# Patient Record
Sex: Female | Born: 1995 | Race: Black or African American | Hispanic: No | Marital: Single | Smoking: Never smoker
Health system: Southern US, Community
[De-identification: ages and names within clinical notes are randomized; demographics above are authoritative.]

## PROBLEM LIST (undated history)

## (undated) ENCOUNTER — Inpatient Hospital Stay (HOSPITAL_COMMUNITY): Payer: Self-pay

## (undated) DIAGNOSIS — M5020 Other cervical disc displacement, unspecified cervical region: Secondary | ICD-10-CM

## (undated) DIAGNOSIS — K219 Gastro-esophageal reflux disease without esophagitis: Secondary | ICD-10-CM

## (undated) HISTORY — DX: Gastro-esophageal reflux disease without esophagitis: K21.9

---
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
## 2017-11-12 NOTE — L&D Delivery Note (Signed)
Patient: Marie Mathews MRN: [id number]  GBS status: negative  Patient is a 22 y.o. now G2P1101 s/p NSVD at 4961w1d, who was admitted for SOL and SROM. Labor complicated by OP presentation, and augmented with IV Pitocin. SROM 17h 6371m prior to delivery with clear fluid. First stage of labor complete at 01:40 am.  Delivery Note After < 30 minutes of pushing, at 2:08 AM a viable female was delivered via Vaginal, Spontaneous (Presentation: vertex; OA).  APGAR: 8, 9; weight  pending  Placenta status: intact.  Cord:  3-vessel  Cord pH: 7.24  Head delivered OA. No nuchal cord present. Shoulder and body delivered in usual fashion. Infant placed on mother's abdomen, dried and bulb suctioned, but only weak cry and poor tone, so cord clamped and cut and infant sent to warmer. Cord arterial pH and cord blood drawn. Placenta delivered spontaneously with gentle cord traction. Fundus firm with massage and Pitocin. Perineum inspected and found to have no lacerations, just superficial abraisons which werefound to be hemostatic.  Anesthesia:  Epidural Episiotomy: None Lacerations: None Suture Repair: none Est. Blood Loss (mL): 150  Mom to postpartum.  Baby to Couplet care / Skin to Skin.  Raynelle FanningJulie P. Julina Altmann, MD OB Fellow 12/23/17, 3:13 AM

## 2017-11-30 ENCOUNTER — Encounter (HOSPITAL_COMMUNITY): Payer: Self-pay

## 2017-11-30 ENCOUNTER — Inpatient Hospital Stay (HOSPITAL_COMMUNITY)
Admission: AD | Admit: 2017-11-30 | Discharge: 2017-11-30 | Disposition: A | Discharge status: Home / Self Care | Payer: Medicaid Other | Source: Ambulatory Visit | Attending: Obstetrics and Gynecology | Admitting: Obstetrics and Gynecology

## 2017-11-30 DIAGNOSIS — O26893 Other specified pregnancy related conditions, third trimester: Secondary | ICD-10-CM

## 2017-11-30 DIAGNOSIS — Z3A35 35 weeks gestation of pregnancy: Secondary | ICD-10-CM | POA: Diagnosis not present

## 2017-11-30 DIAGNOSIS — R1030 Lower abdominal pain, unspecified: Secondary | ICD-10-CM | POA: Diagnosis present

## 2017-11-30 DIAGNOSIS — R109 Unspecified abdominal pain: Secondary | ICD-10-CM

## 2017-11-30 DIAGNOSIS — R030 Elevated blood-pressure reading, without diagnosis of hypertension: Secondary | ICD-10-CM | POA: Insufficient documentation

## 2017-11-30 LAB — URINALYSIS, ROUTINE W REFLEX MICROSCOPIC
Bilirubin Urine: NEGATIVE
GLUCOSE, UA: NEGATIVE mg/dL
HGB URINE DIPSTICK: NEGATIVE
Ketones, ur: NEGATIVE mg/dL
Leukocytes, UA: NEGATIVE
Nitrite: NEGATIVE
Protein, ur: NEGATIVE mg/dL
SPECIFIC GRAVITY, URINE: 1.004 — AB (ref 1.005–1.030)
pH: 6 (ref 5.0–8.0)

## 2017-11-30 NOTE — MAU Provider Note (Signed)
History     CSN: [id number]  Arrival date and time: 11/30/17 [id number]   First Provider Initiated Contact with Patient 11/30/17 0101      Chief Complaint  Patient presents with  . Pelvic Pain   HPI  Marie Mathews is a 22 y.o. G2P0100 at 260w6d who presents to MAU today with complaint of intermittent sharp lower abdominal pains that started 20 minutes prior to arrival. Patient states pain is mostly associated with baby movements. She denies vaginal bleeding or LOF. She is unsure about contractions. She reports normal fetal movement.   OB History    Gravida Para Term Preterm AB Living   2 1   1  0     SAB TAB Ectopic Multiple Live Births   0              No past medical history on file.  No past surgical history on file.  No family history on file.  Social History   Tobacco Use  . Smoking status: Never Smoker  . Smokeless tobacco: Never Used  Substance Use Topics  . Alcohol use: No  . Drug use: No    Allergies:  Allergies  Allergen Reactions  . Bee Venom Anaphylaxis    Medications Prior to Admission  Medication Sig Dispense Refill Last Dose  . acetaminophen-codeine (TYLENOL #3) 300-30 MG per tablet Take 1-2 tablets by mouth every 6 (six) hours as needed for moderate pain. 6 tablet 0   . docusate sodium (COLACE) 100 MG capsule Take 1 capsule (100 mg total) by mouth every 12 (twelve) hours. 60 capsule 0   . fluconazole (DIFLUCAN) 150 MG tablet Take 1 tablet (150 mg total) by mouth daily. 1 tablet 0   . metroNIDAZOLE (FLAGYL) 500 MG tablet Take 1 tablet (500 mg total) by mouth 2 (two) times daily. 14 tablet 0   . nystatin-triamcinolone (MYCOLOG II) cream Apply to affected area BID 30 g 0   . polyethylene glycol powder (GLYCOLAX/MIRALAX) powder Take 255 g by mouth once. TAKE 6 CAPFULS OF MIRALAX IN A 32 OUNCE GATORADE AND DRINK THE WHOLE BEVERAGE FOLLOWED BY 3 CAPFULS TWICE A DAY FOR THE NEXT WEEK AND FOLLOW UP WITH YOUR PRIMARY CARE PHYSICIAN. 255 g 0   . senna  (SENOKOT) 8.6 MG TABS tablet Take 2 tablets by mouth daily as needed for mild constipation.   09/22/2015 at Unknown time    Review of Systems  Constitutional: Negative for fever.  Gastrointestinal: Positive for abdominal pain. Negative for constipation, diarrhea, nausea and vomiting.  Genitourinary: Negative for vaginal bleeding and vaginal discharge.   Physical Exam   Blood pressure 121/66, pulse 100, temperature 98.6 F (37 C), temperature source Oral, resp. rate 18, height 5\' 3"  (1.6 m), weight 182 lb 1.3 oz (82.6 kg), SpO2 100 %.  Physical Exam  Nursing note and vitals reviewed. Constitutional: She is oriented to person, place, and time. She appears well-developed and well-nourished. No distress.  HENT:  Head: Normocephalic and atraumatic.  Cardiovascular: Normal rate.  Respiratory: Effort normal.  GI: Soft. She exhibits no distension and no mass. There is no tenderness. There is no rebound and no guarding.  Neurological: She is alert and oriented to person, place, and time.  Skin: Skin is warm and dry. No erythema.  Psychiatric: She has a normal mood and affect.  Dilation: 2 Effacement (%): Thick Station: -2 Exam by:: Harlon FlorJ. Arryn Terrones PA   Results for orders placed or performed during the hospital encounter of 11/30/17 (  from the past 24 hour(s))  Urinalysis, Routine w reflex microscopic     Status: Abnormal   Collection Time: 11/30/17 12:44 AM  Result Value Ref Range   Color, Urine YELLOW YELLOW   APPearance CLEAR CLEAR   Specific Gravity, Urine 1.004 (L) 1.005 - 1.030   pH 6.0 5.0 - 8.0   Glucose, UA NEGATIVE NEGATIVE mg/dL   Hgb urine dipstick NEGATIVE NEGATIVE   Bilirubin Urine NEGATIVE NEGATIVE   Ketones, ur NEGATIVE NEGATIVE mg/dL   Protein, ur NEGATIVE NEGATIVE mg/dL   Nitrite NEGATIVE NEGATIVE   Leukocytes, UA NEGATIVE NEGATIVE   Fetal Monitoring: Baseline: 125 bpm Variability: moderate Accelerations: 15 x 15 Decelerations: none Contractions: irregular with  moderate UI  MAU Course  Procedures None  MDM UA today - normal Contraction pattern does not indicate active labor. Patient reports improvement in pain with rest.   Assessment and Plan  A: SIUP at [redacted]w[redacted]d Abdominal pain in pregnancy, third trimester   P:  Discharge home Preterm precautions discussed Patient advised to follow-up with [facility] to continue prenatal care. In-basket message sent to make an appointment.  Patient may return to MAU as needed or if her condition were to change or worsen   Vonzella Nipple, PA-C 11/30/2017, 1:01 AM

## 2017-11-30 NOTE — Discharge Instructions (Signed)

## 2017-11-30 NOTE — MAU Note (Signed)
Pt reporting sharp pelvic pain 7/10 starting about 20 mins ago.  Denies leaking or bleeding. Lots of fetal movement.

## 2017-12-12 ENCOUNTER — Other Ambulatory Visit (HOSPITAL_COMMUNITY)
Admission: RE | Admit: 2017-12-12 | Discharge: 2017-12-12 | Disposition: A | Discharge status: Home / Self Care | Payer: Medicaid Other | Source: Ambulatory Visit | Attending: Student | Admitting: Student

## 2017-12-12 ENCOUNTER — Ambulatory Visit (INDEPENDENT_AMBULATORY_CARE_PROVIDER_SITE_OTHER): Payer: Medicaid Other | Admitting: Student

## 2017-12-12 ENCOUNTER — Encounter: Payer: Self-pay | Admitting: Student

## 2017-12-12 VITALS — BP 117/78 | HR 82 | Wt 183.0 lb

## 2017-12-12 DIAGNOSIS — B9689 Other specified bacterial agents as the cause of diseases classified elsewhere: Secondary | ICD-10-CM | POA: Diagnosis not present

## 2017-12-12 DIAGNOSIS — Z6791 Unspecified blood type, Rh negative: Secondary | ICD-10-CM | POA: Diagnosis not present

## 2017-12-12 DIAGNOSIS — O26899 Other specified pregnancy related conditions, unspecified trimester: Secondary | ICD-10-CM

## 2017-12-12 DIAGNOSIS — Z8759 Personal history of other complications of pregnancy, childbirth and the puerperium: Secondary | ICD-10-CM | POA: Insufficient documentation

## 2017-12-12 DIAGNOSIS — Z3480 Encounter for supervision of other normal pregnancy, unspecified trimester: Secondary | ICD-10-CM | POA: Diagnosis not present

## 2017-12-12 DIAGNOSIS — A749 Chlamydial infection, unspecified: Secondary | ICD-10-CM

## 2017-12-12 DIAGNOSIS — O09893 Supervision of other high risk pregnancies, third trimester: Secondary | ICD-10-CM | POA: Diagnosis present

## 2017-12-12 DIAGNOSIS — O98812 Other maternal infectious and parasitic diseases complicating pregnancy, second trimester: Secondary | ICD-10-CM

## 2017-12-12 DIAGNOSIS — D573 Sickle-cell trait: Secondary | ICD-10-CM

## 2017-12-12 DIAGNOSIS — O0933 Supervision of pregnancy with insufficient antenatal care, third trimester: Secondary | ICD-10-CM

## 2017-12-12 DIAGNOSIS — O0930 Supervision of pregnancy with insufficient antenatal care, unspecified trimester: Secondary | ICD-10-CM | POA: Insufficient documentation

## 2017-12-12 HISTORY — DX: Unspecified blood type, rh negative: Z67.91

## 2017-12-12 LAB — OB RESULTS CONSOLE GBS: GBS: NEGATIVE

## 2017-12-12 MED ORDER — RHO D IMMUNE GLOBULIN 1500 UNIT/2ML IJ SOSY
300.0000 ug | PREFILLED_SYRINGE | Freq: Once | INTRAMUSCULAR | Status: AC
  Administered 2017-12-12: 300 ug via INTRAMUSCULAR

## 2017-12-12 MED ORDER — FERROUS SULFATE 325 (65 FE) MG PO TABS: 325.0000 mg | ORAL_TABLET | Freq: Every day | ORAL | 3 refills | Status: DC

## 2017-12-12 MED ORDER — PRENATAL VITAMINS 0.8 MG PO TABS: 1.0000 | ORAL_TABLET | Freq: Every day | ORAL | 12 refills | Status: DC

## 2017-12-12 NOTE — Progress Notes (Addendum)
  Subjective:    Marie Mathews is being seen today for her first obstetrical visit.  This is not a planned pregnancy. She is at 1974w4d gestation. Her obstetrical history is significant for previous pre-term delivery and fetal death at 20 weeks. 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  Relationship with FOB: not involved today. . Patient does intend to breast feed. Pregnancy history fully reviewed.  Patient has had intermittent prenatal care. [facility] [place] [facility] [place] [facility] [place] [facility] and also "another doctor". Her records do not come with an US report but patient says that she was told her US was normal.   Patient reports no complaints and painful boil on her labia that she has popped and it is now hurting. .  Review of Systems:   Review of Systems  Constitutional: Negative.   HENT: Negative.   Eyes: Negative.   Respiratory: Negative.   Cardiovascular: Negative.   Gastrointestinal: Negative.   Genitourinary: Positive for vaginal pain.  Neurological: Negative.   Hematological: Negative.   Psychiatric/Behavioral: Negative.     Objective:     BP 117/78   Pulse 82   Wt 183 lb (83 kg)   LMP  (Approximate)   BMI 32.42 kg/m  Physical Exam  Constitutional: She is oriented to person, place, and time. She appears well-developed and well-nourished.  HENT:  Head: Normocephalic.  Eyes: Pupils are equal, round, and reactive to light.  Neck: Normal range of motion.  GI: Soft.  Genitourinary: No vaginal discharge found.  Genitourinary Comments: Patient has a red open scab the size of a pencil eraser on her right labia; it is tender to the touch. No odor, no oozing of pus.   Musculoskeletal: Normal range of motion.  Neurological: She is alert and oriented to person, place, and time.  Skin: Skin is warm and dry.    Maternal Exam:  Introitus: Vagina is negative for discharge.       Assessment:    Pregnancy:  G2P0100 Patient Active Problem List   Diagnosis Date Noted  . Sickle cell trait (HCC) 12/13/2017  . Chlamydia infection affecting pregnancy 12/13/2017  . Supervision of other normal pregnancy, antepartum 12/12/2017  . History of neonatal death 12/12/2017  . Rh negative state in antepartum period 12/12/2017  . Preterm delivery 12/12/2017  . Limited prenatal care 12/12/2017       Plan:     Initial labs drawn. Prenatal vitamins. Problem list reviewed and updated. AFP3 discussed: too late. . Role of ultrasound in pregnancy discussed; fetal survey: talked with patient about her US from her previous provider; patients says the US was normal and her placenta was in a normal location. . Amniocentesis discussed: not indicated. Follow up in 1 weeks. 75% of 30 min visit spent on counseling and coordination of care.  -Will re-do 1 hour Glucola today as we have no record of her her GTT -Spoke with Dr. Debroah LoopArnold, who doesn't feel its necessary to repeat an anatomy scan now -Rhogam given -Order for iron and PNV sent -Patient will follow up in one week -Recommended antibiotic cream and avoid touching boil; do not wear tight clothing and leave the area clean and dry -labs will be abstracted from previous records -Urine culture today -GBS and GC CT today -confirmed vertex presentation Charles[facility] GaribaldiKathryn Lorraine [facility] 12/13/2017

## 2017-12-12 NOTE — Patient Instructions (Signed)

## 2017-12-13 ENCOUNTER — Other Ambulatory Visit: Payer: Medicaid Other

## 2017-12-13 DIAGNOSIS — O98819 Other maternal infectious and parasitic diseases complicating pregnancy, unspecified trimester: Secondary | ICD-10-CM

## 2017-12-13 DIAGNOSIS — D573 Sickle-cell trait: Secondary | ICD-10-CM | POA: Insufficient documentation

## 2017-12-13 DIAGNOSIS — A749 Chlamydial infection, unspecified: Secondary | ICD-10-CM | POA: Insufficient documentation

## 2017-12-13 HISTORY — DX: Sickle-cell trait: D57.3

## 2017-12-13 LAB — CERVICOVAGINAL ANCILLARY ONLY
BACTERIAL VAGINITIS: POSITIVE — AB
CANDIDA VAGINITIS: NEGATIVE
Chlamydia: NEGATIVE
NEISSERIA GONORRHEA: NEGATIVE
Trichomonas: NEGATIVE

## 2017-12-14 ENCOUNTER — Other Ambulatory Visit: Payer: Self-pay | Admitting: Student

## 2017-12-14 DIAGNOSIS — N760 Acute vaginitis: Principal | ICD-10-CM

## 2017-12-14 DIAGNOSIS — B9689 Other specified bacterial agents as the cause of diseases classified elsewhere: Secondary | ICD-10-CM | POA: Insufficient documentation

## 2017-12-14 LAB — CULTURE, OB URINE

## 2017-12-14 LAB — URINE CULTURE, OB REFLEX

## 2017-12-14 MED ORDER — METRONIDAZOLE 500 MG PO TABS: 500.0000 mg | ORAL_TABLET | Freq: Two times a day (BID) | ORAL | 0 refills | Status: DC

## 2017-12-16 LAB — CULTURE, BETA STREP (GROUP B ONLY): Strep Gp B Culture: NEGATIVE

## 2017-12-19 ENCOUNTER — Ambulatory Visit (INDEPENDENT_AMBULATORY_CARE_PROVIDER_SITE_OTHER): Payer: Medicaid Other | Admitting: Obstetrics & Gynecology

## 2017-12-19 ENCOUNTER — Encounter: Payer: Self-pay | Admitting: Obstetrics & Gynecology

## 2017-12-19 VITALS — BP 124/80 | HR 72 | Wt 186.0 lb

## 2017-12-19 DIAGNOSIS — O09893 Supervision of other high risk pregnancies, third trimester: Secondary | ICD-10-CM | POA: Diagnosis not present

## 2017-12-19 DIAGNOSIS — O98813 Other maternal infectious and parasitic diseases complicating pregnancy, third trimester: Secondary | ICD-10-CM

## 2017-12-19 DIAGNOSIS — Z8759 Personal history of other complications of pregnancy, childbirth and the puerperium: Secondary | ICD-10-CM

## 2017-12-19 DIAGNOSIS — Z6791 Unspecified blood type, Rh negative: Secondary | ICD-10-CM

## 2017-12-19 DIAGNOSIS — A749 Chlamydial infection, unspecified: Secondary | ICD-10-CM

## 2017-12-19 DIAGNOSIS — O26899 Other specified pregnancy related conditions, unspecified trimester: Secondary | ICD-10-CM

## 2017-12-19 DIAGNOSIS — Z3480 Encounter for supervision of other normal pregnancy, unspecified trimester: Secondary | ICD-10-CM

## 2017-12-19 NOTE — Patient Instructions (Signed)
Vaginal Delivery Vaginal delivery means that you will give birth by pushing your baby out of your birth canal (vagina). A team of health care providers will help you before, during, and after vaginal delivery. Birth experiences are unique for every woman and every pregnancy, and birth experiences vary depending on where you choose to give birth. What should I do to prepare for my baby's birth? Before your baby is born, it is important to talk with your health care provider about:  Your labor and delivery preferences. These may include: ? Medicines that you may be given. ? How you will manage your pain. This might include non-medical pain relief techniques or injectable pain relief such as epidural analgesia. ? How you and your baby will be monitored during labor and delivery. ? Who may be in the labor and delivery room with you. ? Your feelings about surgical delivery of your baby (cesarean delivery, or C-section) if this becomes necessary. ? Your feelings about receiving donated blood through an IV tube (blood transfusion) if this becomes necessary.  Whether you are able: ? To take pictures or videos of the birth. ? To eat during labor and delivery. ? To move around, walk, or change positions during labor and delivery.  What to expect after your baby is born, such as: ? Whether delayed umbilical cord clamping and cutting is offered. ? Who will care for your baby right after birth. ? Medicines or tests that may be recommended for your baby. ? Whether breastfeeding is supported in your hospital or birth center. ? How long you will be in the hospital or birth center.  How any medical conditions you have may affect your baby or your labor and delivery experience.  To prepare for your baby's birth, you should also:  Attend all of your health care visits before delivery (prenatal visits) as recommended by your health care provider. This is important.  Prepare your home for your baby's  arrival. Make sure that you have: ? Diapers. ? Baby clothing. ? Feeding equipment. ? Safe sleeping arrangements for you and your baby.  Install a car seat in your vehicle. Have your car seat checked by a certified car seat installer to make sure that it is installed safely.  Think about who will help you with your new baby at home for at least the first several weeks after delivery.  What can I expect when I arrive at the birth center or hospital? Once you are in labor and have been admitted into the hospital or birth center, your health care provider may:  Review your pregnancy history and any concerns you have.  Insert an IV tube into one of your veins. This is used to give you fluids and medicines.  Check your blood pressure, pulse, temperature, and heart rate (vital signs).  Check whether your bag of water (amniotic sac) has broken (ruptured).  Talk with you about your birth plan and discuss pain control options.  Monitoring Your health care provider may monitor your contractions (uterine monitoring) and your baby's heart rate (fetal monitoring). You may need to be monitored:  Often, but not continuously (intermittently).  All the time or for long periods at a time (continuously). Continuous monitoring may be needed if: ? You are taking certain medicines, such as medicine to relieve pain or make your contractions stronger. ? You have pregnancy or labor complications.  Monitoring may be done by:  Placing a special stethoscope or a handheld monitoring device on your abdomen to   check your baby's heartbeat, and feeling your abdomen for contractions. This method of monitoring does not continuously record your baby's heartbeat or your contractions.  Placing monitors on your abdomen (external monitors) to record your baby's heartbeat and the frequency and length of contractions. You may not have to wear external monitors all the time.  Placing monitors inside of your uterus  (internal monitors) to record your baby's heartbeat and the frequency, length, and strength of your contractions. ? Your health care provider may use internal monitors if he or she needs more information about the strength of your contractions or your baby's heart rate. ? Internal monitors are put in place by passing a thin, flexible wire through your vagina and into your uterus. Depending on the type of monitor, it may remain in your uterus or on your baby's head until birth. ? Your health care provider will discuss the benefits and risks of internal monitoring with you and will ask for your permission before inserting the monitors.  Telemetry. This is a type of continuous monitoring that can be done with external or internal monitors. Instead of having to stay in bed, you are able to move around during telemetry. Ask your health care provider if telemetry is an option for you.  Physical exam Your health care provider may perform a physical exam. This may include:  Checking whether your baby is positioned: ? With the head toward your vagina (head-down). This is most common. ? With the head toward the top of your uterus (head-up or breech). If your baby is in a breech position, your health care provider may try to turn your baby to a head-down position so you can deliver vaginally. If it does not seem that your baby can be born vaginally, your provider may recommend surgery to deliver your baby. In rare cases, you may be able to deliver vaginally if your baby is head-up (breech delivery). ? Lying sideways (transverse). Babies that are lying sideways cannot be delivered vaginally.  Checking your cervix to determine: ? Whether it is thinning out (effacing). ? Whether it is opening up (dilating). ? How low your baby has moved into your birth canal.  What are the three stages of labor and delivery?  Normal labor and delivery is divided into the following three stages: Stage 1  Stage 1 is the  longest stage of labor, and it can last for hours or days. Stage 1 includes: ? Early labor. This is when contractions may be irregular, or regular and mild. Generally, early labor contractions are more than 10 minutes apart. ? Active labor. This is when contractions get longer, more regular, more frequent, and more intense. ? The transition phase. This is when contractions happen very close together, are very intense, and may last longer than during any other part of labor.  Contractions generally feel mild, infrequent, and irregular at first. They get stronger, more frequent (about every 2-3 minutes), and more regular as you progress from early labor through active labor and transition.  Many women progress through stage 1 naturally, but you may need help to continue making progress. If this happens, your health care provider may talk with you about: ? Rupturing your amniotic sac if it has not ruptured yet. ? Giving you medicine to help make your contractions stronger and more frequent.  Stage 1 ends when your cervix is completely dilated to 4 inches (10 cm) and completely effaced. This happens at the end of the transition phase. Stage 2  Once   your cervix is completely effaced and dilated to 4 inches (10 cm), you may start to feel an urge to push. It is common for the body to naturally take a rest before feeling the urge to push, especially if you received an epidural or certain other pain medicines. This rest period may last for up to 1-2 hours, depending on your unique labor experience.  During stage 2, contractions are generally less painful, because pushing helps relieve contraction pain. Instead of contraction pain, you may feel stretching and burning pain, especially when the widest part of your baby's head passes through the vaginal opening (crowning).  Your health care provider will closely monitor your pushing progress and your baby's progress through the vagina during stage 2.  Your  health care provider may massage the area of skin between your vaginal opening and anus (perineum) or apply warm compresses to your perineum. This helps it stretch as the baby's head starts to crown, which can help prevent perineal tearing. ? In some cases, an incision may be made in your perineum (episiotomy) to allow the baby to pass through the vaginal opening. An episiotomy helps to make the opening of the vagina larger to allow more room for the baby to fit through.  It is very important to breathe and focus so your health care provider can control the delivery of your baby's head. Your health care provider may have you decrease the intensity of your pushing, to help prevent perineal tearing.  After delivery of your baby's head, the shoulders and the rest of the body generally deliver very quickly and without difficulty.  Once your baby is delivered, the umbilical cord may be cut right away, or this may be delayed for 1-2 minutes, depending on your baby's health. This may vary among health care providers, hospitals, and birth centers.  If you and your baby are healthy enough, your baby may be placed on your chest or abdomen to help maintain the baby's temperature and to help you bond with each other. Some mothers and babies start breastfeeding at this time. Your health care team will dry your baby and help keep your baby warm during this time.  Your baby may need immediate care if he or she: ? Showed signs of distress during labor. ? Has a medical condition. ? Was born too early (prematurely). ? Had a bowel movement before birth (meconium). ? Shows signs of difficulty transitioning from being inside the uterus to being outside of the uterus. If you are planning to breastfeed, your health care team will help you begin a feeding. Stage 3  The third stage of labor starts immediately after the birth of your baby and ends after you deliver the placenta. The placenta is an organ that develops  during pregnancy to provide oxygen and nutrients to your baby in the womb.  Delivering the placenta may require some pushing, and you may have mild contractions. Breastfeeding can stimulate contractions to help you deliver the placenta.  After the placenta is delivered, your uterus should tighten (contract) and become firm. This helps to stop bleeding in your uterus. To help your uterus contract and to control bleeding, your health care provider may: ? Give you medicine by injection, through an IV tube, by mouth, or through your rectum (rectally). ? Massage your abdomen or perform a vaginal exam to remove any blood clots that are left in your uterus. ? Empty your bladder by placing a thin, flexible tube (catheter) into your bladder. ? Encourage   you to breastfeed your baby. After labor is over, you and your baby will be monitored closely to ensure that you are both healthy until you are ready to go home. Your health care team will teach you how to care for yourself and your baby. This information is not intended to replace advice given to you by your health care provider. Make sure you discuss any questions you have with your health care provider. Document Released: 08/07/2008 Document Revised: 05/18/2016 Document Reviewed: 11/13/2015 Elsevier Interactive Patient Education  2018 [employer]  

## 2017-12-19 NOTE — Progress Notes (Signed)
   PRENATAL VISIT NOTE  Subjective:  Marie Mathews is a 22 y.o. G2P0100 at 3583w4d being seen today for ongoing prenatal care.  She is currently monitored for the following issues for this low-risk pregnancy and has Supervision of other normal pregnancy, antepartum; History of neonatal death; Rh negative state in antepartum period; Preterm delivery; Limited prenatal care; Sickle cell trait (HCC); Chlamydia infection affecting pregnancy; and Bacterial vaginosis on their problem list.  Patient reports no complaints.  Contractions: Irritability. Vag. Bleeding: None.  Movement: Present. Denies leaking of fluid.   The following portions of the patient's history were reviewed and updated as appropriate: allergies, current medications, past family history, past medical history, past social history, past surgical history and problem list. Problem list updated.  Objective:   Vitals:   12/19/17 1016  BP: 124/80  Pulse: 72  Weight: 186 lb (84.4 kg)    Fetal Status: Fetal Heart Rate (bpm): 133   Movement: Present     General:  Alert, oriented and cooperative. Patient is in no acute distress.  Skin: Skin is warm and dry. No rash noted.   Cardiovascular: Normal heart rate noted  Respiratory: Normal respiratory effort, no problems with respiration noted  Abdomen: Soft, gravid, appropriate for gestational age.  Pain/Pressure: Present     Pelvic: Cervical exam deferred        Extremities: Normal range of motion.  Edema: Trace  Mental Status:  Normal mood and affect. Normal behavior. Normal judgment and thought content.   Assessment and Plan:  Pregnancy: G2P0100 at 3983w4d  1. Supervision of other normal pregnancy, antepartum Late care  2. Rh negative state in antepartum period S/p rhogam  3. History of neonatal death 20 weeks  4. Chlamydia infection affecting pregnancy in third trimester   Term labor symptoms and general obstetric precautions including but not limited to vaginal bleeding,  contractions, leaking of fluid and fetal movement were reviewed in detail with the patient. Please refer to After Visit Summary for other counseling recommendations.  Return in about 1 week (around 12/26/2017).   Scheryl DarterJames Arnold, MD

## 2017-12-22 ENCOUNTER — Inpatient Hospital Stay (HOSPITAL_COMMUNITY): Payer: Medicaid Other | Admitting: Anesthesiology

## 2017-12-22 ENCOUNTER — Inpatient Hospital Stay (HOSPITAL_COMMUNITY)
Admission: AD | Admit: 2017-12-22 | Discharge: 2017-12-25 | DRG: 807 | Disposition: A | Discharge status: Home / Self Care | Payer: Medicaid Other | Source: Ambulatory Visit | Attending: Obstetrics & Gynecology | Admitting: Obstetrics & Gynecology

## 2017-12-22 ENCOUNTER — Other Ambulatory Visit: Payer: Self-pay

## 2017-12-22 ENCOUNTER — Encounter (HOSPITAL_COMMUNITY): Payer: Self-pay

## 2017-12-22 DIAGNOSIS — D573 Sickle-cell trait: Secondary | ICD-10-CM | POA: Diagnosis present

## 2017-12-22 DIAGNOSIS — Z3480 Encounter for supervision of other normal pregnancy, unspecified trimester: Secondary | ICD-10-CM

## 2017-12-22 DIAGNOSIS — Z6791 Unspecified blood type, Rh negative: Secondary | ICD-10-CM

## 2017-12-22 DIAGNOSIS — Z3A39 39 weeks gestation of pregnancy: Secondary | ICD-10-CM

## 2017-12-22 DIAGNOSIS — Z3483 Encounter for supervision of other normal pregnancy, third trimester: Secondary | ICD-10-CM | POA: Diagnosis present

## 2017-12-22 DIAGNOSIS — O9902 Anemia complicating childbirth: Secondary | ICD-10-CM | POA: Diagnosis present

## 2017-12-22 DIAGNOSIS — O26893 Other specified pregnancy related conditions, third trimester: Principal | ICD-10-CM | POA: Diagnosis present

## 2017-12-22 DIAGNOSIS — Z8759 Personal history of other complications of pregnancy, childbirth and the puerperium: Secondary | ICD-10-CM

## 2017-12-22 DIAGNOSIS — O0930 Supervision of pregnancy with insufficient antenatal care, unspecified trimester: Secondary | ICD-10-CM

## 2017-12-22 LAB — CBC
HEMATOCRIT: 32.2 % — AB (ref 36.0–46.0)
Hemoglobin: 11.3 g/dL — ABNORMAL LOW (ref 12.0–15.0)
MCH: 27.8 pg (ref 26.0–34.0)
MCHC: 35.1 g/dL (ref 30.0–36.0)
MCV: 79.3 fL (ref 78.0–100.0)
Platelets: 265 10*3/uL (ref 150–400)
RBC: 4.06 MIL/uL (ref 3.87–5.11)
RDW: 13.2 % (ref 11.5–15.5)
WBC: 9.3 10*3/uL (ref 4.0–10.5)

## 2017-12-22 LAB — RAPID URINE DRUG SCREEN, HOSP PERFORMED
AMPHETAMINES: NOT DETECTED
Barbiturates: NOT DETECTED
Benzodiazepines: NOT DETECTED
COCAINE: NOT DETECTED
OPIATES: NOT DETECTED
TETRAHYDROCANNABINOL: NOT DETECTED

## 2017-12-22 LAB — RAPID HIV SCREEN (HIV 1/2 AB+AG)
HIV 1/2 Antibodies: NONREACTIVE
HIV-1 P24 Antigen - HIV24: NONREACTIVE

## 2017-12-22 LAB — HEPATITIS B SURFACE ANTIGEN: Hepatitis B Surface Ag: NEGATIVE

## 2017-12-22 LAB — POCT FERN TEST: POCT Fern Test: NEGATIVE

## 2017-12-22 MED ORDER — BUPIVACAINE IN DEXTROSE 0.75-8.25 % IT SOLN
INTRATHECAL | Status: AC
  Filled 2017-12-22: qty 2

## 2017-12-22 MED ORDER — LIDOCAINE-EPINEPHRINE (PF) 2 %-1:200000 IJ SOLN
INTRAMUSCULAR | Status: DC | PRN
  Administered 2017-12-22: 2 mL via INTRADERMAL
  Administered 2017-12-22: 3 mL
  Administered 2017-12-22: 2 mL via INTRADERMAL
  Administered 2017-12-22: 3 mL via INTRADERMAL

## 2017-12-22 MED ORDER — LACTATED RINGERS IV SOLN
INTRAVENOUS | Status: DC
  Administered 2017-12-22 – 2017-12-23 (×2): via INTRAVENOUS

## 2017-12-22 MED ORDER — EPHEDRINE 5 MG/ML INJ
10.0000 mg | INTRAVENOUS | Status: DC | PRN
  Filled 2017-12-22: qty 2

## 2017-12-22 MED ORDER — ACETAMINOPHEN 325 MG PO TABS: 650.0000 mg | ORAL_TABLET | ORAL | Status: DC | PRN

## 2017-12-22 MED ORDER — OXYTOCIN 40 UNITS IN LACTATED RINGERS INFUSION - SIMPLE MED
1.0000 m[IU]/min | INTRAVENOUS | Status: DC
  Administered 2017-12-22: 2 m[IU]/min via INTRAVENOUS

## 2017-12-22 MED ORDER — PHENYLEPHRINE 40 MCG/ML (10ML) SYRINGE FOR IV PUSH (FOR BLOOD PRESSURE SUPPORT)
80.0000 ug | PREFILLED_SYRINGE | INTRAVENOUS | Status: DC | PRN
  Filled 2017-12-22: qty 5

## 2017-12-22 MED ORDER — BUPIVACAINE HCL (PF) 0.25 % IJ SOLN
INTRAMUSCULAR | Status: DC | PRN
  Administered 2017-12-22 (×2): 5 mL via EPIDURAL

## 2017-12-22 MED ORDER — SOD CITRATE-CITRIC ACID 500-334 MG/5ML PO SOLN: 30.0000 mL | ORAL | Status: DC | PRN

## 2017-12-22 MED ORDER — OXYTOCIN 40 UNITS IN LACTATED RINGERS INFUSION - SIMPLE MED
2.5000 [IU]/h | INTRAVENOUS | Status: DC
  Filled 2017-12-22: qty 1000

## 2017-12-22 MED ORDER — FENTANYL CITRATE (PF) 100 MCG/2ML IJ SOLN
INTRAMUSCULAR | Status: AC
  Filled 2017-12-22: qty 2

## 2017-12-22 MED ORDER — FENTANYL CITRATE (PF) 100 MCG/2ML IJ SOLN
INTRAMUSCULAR | Status: DC | PRN
  Administered 2017-12-22: 100 ug via EPIDURAL

## 2017-12-22 MED ORDER — FENTANYL 2.5 MCG/ML BUPIVACAINE 1/10 % EPIDURAL INFUSION (WH - ANES)
14.0000 mL/h | INTRAMUSCULAR | Status: DC | PRN
  Administered 2017-12-22 (×3): 14 mL/h via EPIDURAL
  Filled 2017-12-22 (×2): qty 100

## 2017-12-22 MED ORDER — LACTATED RINGERS IV SOLN
500.0000 mL | INTRAVENOUS | Status: DC | PRN
  Administered 2017-12-22 – 2017-12-23 (×2): 500 mL via INTRAVENOUS

## 2017-12-22 MED ORDER — OXYCODONE-ACETAMINOPHEN 5-325 MG PO TABS: 1.0000 | ORAL_TABLET | ORAL | Status: DC | PRN

## 2017-12-22 MED ORDER — LIDOCAINE HCL (PF) 1 % IJ SOLN
30.0000 mL | INTRAMUSCULAR | Status: DC | PRN
  Filled 2017-12-22: qty 30

## 2017-12-22 MED ORDER — OXYTOCIN BOLUS FROM INFUSION
500.0000 mL | Freq: Once | INTRAVENOUS | Status: AC
  Administered 2017-12-23: 500 mL via INTRAVENOUS

## 2017-12-22 MED ORDER — DIPHENHYDRAMINE HCL 50 MG/ML IJ SOLN
12.5000 mg | INTRAMUSCULAR | Status: DC | PRN
  Filled 2017-12-22: qty 1

## 2017-12-22 MED ORDER — OXYCODONE-ACETAMINOPHEN 5-325 MG PO TABS: 2.0000 | ORAL_TABLET | ORAL | Status: DC | PRN

## 2017-12-22 MED ORDER — TERBUTALINE SULFATE 1 MG/ML IJ SOLN
0.2500 mg | Freq: Once | INTRAMUSCULAR | Status: DC | PRN
  Filled 2017-12-22: qty 1

## 2017-12-22 MED ORDER — LIDOCAINE HCL (PF) 1 % IJ SOLN
INTRAMUSCULAR | Status: DC | PRN
  Administered 2017-12-22: 5 mL via EPIDURAL
  Administered 2017-12-22 (×2): 4 mL via EPIDURAL
  Administered 2017-12-22: 5 mL via EPIDURAL

## 2017-12-22 MED ORDER — FENTANYL CITRATE (PF) 100 MCG/2ML IJ SOLN
100.0000 ug | INTRAMUSCULAR | Status: DC | PRN
  Administered 2017-12-22: 100 ug via INTRAVENOUS
  Filled 2017-12-22: qty 2

## 2017-12-22 MED ORDER — FLEET ENEMA 7-19 GM/118ML RE ENEM: 1.0000 | ENEMA | RECTAL | Status: DC | PRN

## 2017-12-22 MED ORDER — ONDANSETRON HCL 4 MG/2ML IJ SOLN: 4.0000 mg | Freq: Four times a day (QID) | INTRAMUSCULAR | Status: DC | PRN

## 2017-12-22 MED ORDER — LACTATED RINGERS IV SOLN: 500.0000 mL | Freq: Once | INTRAVENOUS | Status: DC

## 2017-12-22 NOTE — Progress Notes (Signed)
SVE 7.5cm/80% now, minimal change.  IUPC placed. Will start IV Pitocin  Annalis Kaczmarczyk P. Roshaunda Starkey, MD OB Fellow

## 2017-12-22 NOTE — Anesthesia Pain Management Evaluation Note (Signed)
  CRNA Pain Management Visit Note  Patient: Marie Mathews, 22 y.o., female  "Hello I am a [facility] of the [facility]. We have an anesthesia team available at all times to provide care throughout the hospital, including epidural management and anesthesia for C-section. I don't know your plan for the delivery whether it a natural birth, water birth, IV sedation, nitrous supplementation, doula or epidural, but we want to meet your pain goals."   1.Was your pain managed to your expectations on prior hospitalizations?   No prior hospitalizations  2.What is your expectation for pain management during this hospitalization?     Epidural  3.How can we help you reach that goal? Epidural  Record the patient's initial score and the patient's pain goal.   Pain: 1  Pain Goal: 10 [facility] wants you to be able to say your pain was always managed very well.  Rica RecordsICKELTON,Unknown Schleyer 12/22/2017

## 2017-12-22 NOTE — Anesthesia Procedure Notes (Signed)
Epidural Patient location during procedure: OB Start time: 12/22/2017 7:00 PM  Staffing Anesthesiologist: Leonides GrillsEllender, Jhaden Pizzuto P, MD Performed: anesthesiologist   Preanesthetic Checklist Completed: patient identified, site marked, pre-op evaluation, timeout performed, IV checked, risks and benefits discussed and monitors and equipment checked  Epidural Patient position: sitting Prep: DuraPrep Patient monitoring: heart rate, cardiac monitor, continuous pulse ox and blood pressure Approach: midline Location: L4-L5 Injection technique: LOR air  Needle:  Needle type: Tuohy  Needle gauge: 17 G Needle length: 9 cm Needle insertion depth: 7 cm Catheter type: closed end flexible Catheter size: 19 Gauge Catheter at skin depth: 12 cm Test dose: negative and Other  Assessment Events: blood not aspirated, injection not painful, no injection resistance and negative IV test  Additional Notes Informed consent obtained prior to proceeding including risk of failure, 1% risk of PDPH, risk of minor discomfort and bruising. Discussed alternatives to epidural analgesia and patient desires to proceed.  Timeout performed pre-procedure verifying patient name, procedure, and platelet count.  Patient tolerated procedure well. Reason for block:procedure for pain

## 2017-12-22 NOTE — Anesthesia Procedure Notes (Addendum)
Epidural Patient location during procedure: OB Start time: 12/22/2017 9:29 AM  Staffing Anesthesiologist: Leonides GrillsEllender, Kenzli Barritt P, MD Performed: anesthesiologist   Preanesthetic Checklist Completed: patient identified, site marked, pre-op evaluation, timeout performed, IV checked, risks and benefits discussed and monitors and equipment checked  Epidural Patient position: sitting Prep: DuraPrep Patient monitoring: heart rate, cardiac monitor, continuous pulse ox and blood pressure Approach: midline Location: L3-L4 Injection technique: LOR air  Needle:  Needle type: Tuohy  Needle gauge: 17 G Needle length: 9 cm Needle insertion depth: 7 cm Catheter type: closed end flexible Catheter size: 19 Gauge Catheter at skin depth: 12 cm Test dose: negative and Other  Assessment Events: blood not aspirated, injection not painful, no injection resistance and negative IV test  Additional Notes Informed consent obtained prior to proceeding including risk of failure, 1% risk of PDPH, risk of minor discomfort and bruising. Discussed alternatives to epidural analgesia and patient desires to proceed.  Timeout performed pre-procedure verifying patient name, procedure, and platelet count.  Patient tolerated procedure well. Reason for block:procedure for pain

## 2017-12-22 NOTE — H&P (Signed)
LABOR AND DELIVERY ADMISSION HISTORY AND PHYSICAL NOTE  Marie Mathews is a 22 y.o. female G2P0100 with IUP at [redacted]w[redacted]d by 10-wk [place] in CT presenting for SOL. Patient reports feeling contractions every 5 min at home which brought her to MAU.  She reports positive fetal movement. She denies vaginal bleeding. Reports leakage of fluid at home and continued leakage while in MAU. Fern test negative in MAU.  Prenatal History/Complications: [facility] Pregnancy complications:  - limited PNC, had some care in [place] and just began care in [place] on 01/02/18 - h/o neonatal death  - Rh neg  - h/o preterm delivery  - sickle cell trait  - chlamydia infection affecting pregnancy  - BV during pregnancy   Sono: No anatomy ultrasound on file  cephalic presentation, posterior placenta   Past Medical History: History reviewed. No pertinent past medical history.  Past Surgical History: History reviewed. No pertinent surgical history.  Obstetrical History: OB History    Gravida Para Term Preterm AB Living   2 1   1  0     SAB TAB Ectopic Multiple Live Births   0              Social History: Social History   Socioeconomic History  . Marital status: Single    Spouse name: None  . Number of children: None  . Years of education: None  . Highest education level: None  Social Needs  . Financial resource strain: None  . Food insecurity - worry: None  . Food insecurity - inability: None  . Transportation needs - medical: None  . Transportation needs - non-medical: None  Occupational History  . None  Tobacco Use  . Smoking status: Never Smoker  . Smokeless tobacco: Never Used  Substance and Sexual Activity  . Alcohol use: No  . Drug use: No  . Sexual activity: Yes    Birth control/protection: None  Other Topics Concern  . None  Social History Narrative  . None    Family History: History reviewed. No pertinent family history.  Allergies: Allergies  Allergen Reactions  . Bee  Venom Anaphylaxis    Medications Prior to Admission  Medication Sig Dispense Refill Last Dose  . ferrous sulfate 325 (65 FE) MG tablet Take 1 tablet (325 mg total) by mouth daily with breakfast. (Patient not taking: Reported on 12/19/2017) 30 tablet 3 Not Taking  . metroNIDAZOLE (FLAGYL) 500 MG tablet Take 1 tablet (500 mg total) by mouth 2 (two) times daily. 14 tablet 0 Taking  . Prenatal Multivit-Min-Fe-FA (PRENATAL VITAMINS) 0.8 MG tablet Take 1 tablet by mouth daily. 30 tablet 12 Taking     Review of Systems  All systems reviewed and negative except as stated in HPI  Physical Exam Blood pressure 131/87, pulse 70, temperature 97.7 F (36.5 C), temperature source Oral, resp. rate 18, height 5\' 4"  (1.626 m), weight 83.6 kg (184 lb 3.2 oz). General appearance: alert, oriented, NAD Lungs: normal respiratory effort Heart: regular rate Abdomen: soft, non-tender; gravid, FH appropriate for GA Extremities: No calf swelling or tenderness Presentation: cephalic per MAU exam  Fetal monitoring: FHR 145, moderate variability, +accels, -decels  Uterine activity: irregular every 1-2 min  Dilation: 5 Effacement (%): 90 Station: -1 Exam by:: n druebbisch rn  Prenatal labs: ABO, Rh:  O neg  Antibody:  neg  Rubella:  pending  RPR:  non-reactive  HBsAg:  pending  HIV:  pending  GC/Chlamydia: negative January 02, 2018 GBS: Negative Jan 02, 2023 0000)  2-hr  GTT: no results available  Genetic screening:  None-available  Anatomy US: no results available   Prenatal Transfer Tool  Maternal Diabetes: No Genetic Screening: no-results available  Maternal Ultrasounds/Referrals: no anatomy screen on records  Fetal Ultrasounds or other Referrals:  None Maternal Substance Abuse:  No Significant Maternal Medications:  None Significant Maternal Lab Results: Lab values include: Group B Strep negative  Results for orders placed or performed during the hospital encounter of 12/22/17 (from the past 24 hour(s))   POCT fern test   Collection Time: 12/22/17  8:20 AM  Result Value Ref Range   POCT Fern Test Negative = intact amniotic membranes   CBC   Collection Time: 12/22/17  8:22 AM  Result Value Ref Range   WBC 9.3 4.0 - 10.5 K/uL   RBC 4.06 3.87 - 5.11 MIL/uL   Hemoglobin 11.3 (L) 12.0 - 15.0 g/dL   HCT 45.432.2 (L) 09.836.0 - 11.946.0 %   MCV 79.3 78.0 - 100.0 fL   MCH 27.8 26.0 - 34.0 pg   MCHC 35.1 30.0 - 36.0 g/dL   RDW 14.713.2 82.911.5 - 56.215.5 %   Platelets 265 150 - 400 K/uL    Patient Active Problem List   Diagnosis Date Noted  . Normal labor 12/22/2017  . Bacterial vaginosis 12/14/2017  . Sickle cell trait (HCC) 12/13/2017  . Chlamydia infection affecting pregnancy 12/13/2017  . Supervision of other normal pregnancy, antepartum 12/12/2017  . History of neonatal death 12/12/2017  . Rh negative state in antepartum period 12/12/2017  . Preterm delivery 12/12/2017  . Limited prenatal care 12/12/2017    Assessment: Marie Mathews is a 22 y.o. G2P0100 at 4329w0d here for SOL  #Labor: anticipate NSVD, expectant management  #Pain: Per patient request, patient wanting epidural  #FWB: Category 1  #ID:  GBS neg, Hep B pending  #MOF: breast  #MOC:Nexplanon  #Circ:  Unknown sex of baby   Oralia ManisSherin Abraham, DO PGY-1 12/22/2017, 9:22 AM  OB FELLOW HISTORY AND PHYSICAL ATTESTATION I have seen and examined this patient; I agree with above documentation in the resident's note.   SOL, ?SROM. GBS negative Limited PNC, some records available from CT under Media tab. Some labs missing, we have ordered them. Pt had anatomy scan in CT, but report not available.  Will get epidural FHT Cat I  Frederik PearJulie P Degele, MD OB Fellow 12/22/2017, 9:36 AM

## 2017-12-22 NOTE — Anesthesia Preprocedure Evaluation (Signed)
Anesthesia Evaluation  Patient identified by MRN, date of birth, ID band Patient awake    Reviewed: Allergy & Precautions, H&P , NPO status , Patient's Chart, lab work & pertinent test results  History of Anesthesia Complications Negative for: history of anesthetic complications  Airway Mallampati: II  TM Distance: >3 FB Neck ROM: full    Dental no notable dental hx. (+) Teeth Intact   Pulmonary neg pulmonary ROS,    Pulmonary exam normal breath sounds clear to auscultation       Cardiovascular negative cardio ROS Normal cardiovascular exam Rhythm:regular Rate:Normal     Neuro/Psych negative neurological ROS  negative psych ROS   GI/Hepatic negative GI ROS, Neg liver ROS,   Endo/Other  negative endocrine ROS  Renal/GU negative Renal ROS  negative genitourinary   Musculoskeletal   Abdominal (+) + obese,   Peds  Hematology  (+) anemia ,   Anesthesia Other Findings   Reproductive/Obstetrics (+) Pregnancy                             Anesthesia Physical Anesthesia Plan  ASA: II  Anesthesia Plan: Epidural   Post-op Pain Management:    Induction:   PONV Risk Score and Plan:   Airway Management Planned:   Additional Equipment:   Intra-op Plan:   Post-operative Plan:   Informed Consent: I have reviewed the patients History and Physical, chart, labs and discussed the procedure including the risks, benefits and alternatives for the proposed anesthesia with the patient or authorized representative who has indicated his/her understanding and acceptance.     Plan Discussed with:   Anesthesia Plan Comments:         Anesthesia Quick Evaluation  

## 2017-12-22 NOTE — MAU Note (Signed)
Reports leaking clear fluid since sometime early this morning (about 0400), ctx started around 6  No vag bleeding, +FM

## 2017-12-22 NOTE — Progress Notes (Signed)
LABOR PROGRESS NOTE  Marie Mathews is a 22 y.o. G2P0100 at 7758w0d  admitted for SOL with SROM.  Subjective: Patient has epidural, but still feeling a lot of pain. Anesthesia in room providing additional meds  Objective: BP 117/74   Pulse 67   Temp 97.7 F (36.5 C) (Oral)   Resp 18   Ht 5\' 4"  (1.626 m)   Wt 184 lb 3.2 oz (83.6 kg)   LMP  (Approximate)   BMI 31.62 kg/m  or  Vitals:   12/22/17 1416 12/22/17 1424 12/22/17 1426 12/22/17 1431  BP: 136/85 106/76 120/69 117/74  Pulse: 86 74 75 67  Resp:      Temp:      TempSrc:      Weight:      Height:        SVE 6-7 cm/80%, with edematous anterior lip.  Bedside U/S: OP  FHT: baseline rate 130, moderate varibility, + acel, no decel Toco: ctx q 2 min   Assessment / Plan: 21 y.o. G2P0100 at 658w0d here for SOL with SROM around 0850 today.  Labor: No cervical change since 10 am. Fetal head position is OP and has significant molding. Pt placed in exaggerated Sims position. Contraction pattern is regular now. Will plan to recheck in w/in 2 hours. If no cervical change, will place IUPC and start Pitocin Fetal Wellbeing:  Cat I Pain Control:  Epidural; anesthesia working on better pain control  Anticipated MOD:  Hopeful for SVD  Frederik PearJulie P Darivs Lunden, MD 12/22/2017, 2:41 PM

## 2017-12-23 ENCOUNTER — Encounter (HOSPITAL_COMMUNITY): Payer: Self-pay

## 2017-12-23 DIAGNOSIS — Z3A39 39 weeks gestation of pregnancy: Secondary | ICD-10-CM

## 2017-12-23 LAB — RPR: RPR Ser Ql: NONREACTIVE

## 2017-12-23 LAB — RUBELLA SCREEN: Rubella: 1.51 index (ref >0.99)

## 2017-12-23 MED ORDER — WITCH HAZEL-GLYCERIN EX PADS: 1.0000 "application " | MEDICATED_PAD | CUTANEOUS | Status: DC | PRN

## 2017-12-23 MED ORDER — DIPHENHYDRAMINE HCL 50 MG/ML IJ SOLN
25.0000 mg | Freq: Once | INTRAMUSCULAR | Status: AC
  Administered 2017-12-23: 25 mg via INTRAVENOUS
  Filled 2017-12-23: qty 1

## 2017-12-23 MED ORDER — IBUPROFEN 600 MG PO TABS
600.0000 mg | ORAL_TABLET | Freq: Four times a day (QID) | ORAL | Status: DC
  Administered 2017-12-23 – 2017-12-25 (×10): 600 mg via ORAL
  Filled 2017-12-23 (×10): qty 1

## 2017-12-23 MED ORDER — PRENATAL MULTIVITAMIN CH
1.0000 | ORAL_TABLET | Freq: Every day | ORAL | Status: DC
  Administered 2017-12-23 – 2017-12-24 (×2): 1 via ORAL
  Filled 2017-12-23 (×2): qty 1

## 2017-12-23 MED ORDER — ONDANSETRON HCL 4 MG PO TABS: 4.0000 mg | ORAL_TABLET | ORAL | Status: DC | PRN

## 2017-12-23 MED ORDER — DIPHENHYDRAMINE HCL 25 MG PO CAPS: 25.0000 mg | ORAL_CAPSULE | Freq: Four times a day (QID) | ORAL | Status: DC | PRN

## 2017-12-23 MED ORDER — SENNOSIDES-DOCUSATE SODIUM 8.6-50 MG PO TABS
2.0000 | ORAL_TABLET | ORAL | Status: DC
  Administered 2017-12-23 – 2017-12-24 (×2): 2 via ORAL
  Filled 2017-12-23 (×2): qty 2

## 2017-12-23 MED ORDER — ACETAMINOPHEN 325 MG PO TABS
650.0000 mg | ORAL_TABLET | ORAL | Status: DC | PRN
  Administered 2017-12-24: 650 mg via ORAL
  Filled 2017-12-23: qty 2

## 2017-12-23 MED ORDER — TETANUS-DIPHTH-ACELL PERTUSSIS 5-2.5-18.5 LF-MCG/0.5 IM SUSP: 0.5000 mL | Freq: Once | INTRAMUSCULAR | Status: DC

## 2017-12-23 MED ORDER — BENZOCAINE-MENTHOL 20-0.5 % EX AERO
1.0000 "application " | INHALATION_SPRAY | CUTANEOUS | Status: DC | PRN
  Administered 2017-12-23: 1 via TOPICAL
  Filled 2017-12-23: qty 56

## 2017-12-23 MED ORDER — DIBUCAINE 1 % RE OINT: 1.0000 "application " | TOPICAL_OINTMENT | RECTAL | Status: DC | PRN

## 2017-12-23 MED ORDER — COCONUT OIL OIL
1.0000 "application " | TOPICAL_OIL | Status: DC | PRN
  Administered 2017-12-23: 1 via TOPICAL
  Filled 2017-12-23: qty 120

## 2017-12-23 MED ORDER — ZOLPIDEM TARTRATE 5 MG PO TABS: 5.0000 mg | ORAL_TABLET | Freq: Every evening | ORAL | Status: DC | PRN

## 2017-12-23 MED ORDER — SIMETHICONE 80 MG PO CHEW: 80.0000 mg | CHEWABLE_TABLET | ORAL | Status: DC | PRN

## 2017-12-23 MED ORDER — ONDANSETRON HCL 4 MG/2ML IJ SOLN
4.0000 mg | INTRAMUSCULAR | Status: DC | PRN
Start: 2017-12-23 — End: 2017-12-25

## 2017-12-23 NOTE — Anesthesia Postprocedure Evaluation (Signed)
Anesthesia Post Note  Patient: Marie Mathews  Procedure(s) Performed: AN AD HOC LABOR EPIDURAL     Patient location during evaluation: Mother Baby Anesthesia Type: Epidural Level of consciousness: awake and alert Pain management: pain level controlled Vital Signs Assessment: post-procedure vital signs reviewed and stable Respiratory status: spontaneous breathing, nonlabored ventilation and respiratory function stable Cardiovascular status: stable Postop Assessment: no headache, no backache, epidural receding, patient able to bend at knees, adequate PO intake and no apparent nausea or vomiting Anesthetic complications: no    Last Vitals:  Vitals:   12/23/17 0423 12/23/17 0522  BP: 135/73 131/85  Pulse: 66 67  Resp: 18 18  Temp: 37 C 36.8 C  SpO2:      Last Pain:  Vitals:   12/23/17 0522  TempSrc: Oral  PainSc: 0-No pain   Pain Goal:                 [name],[name]

## 2017-12-23 NOTE — Lactation Note (Signed)
This note was copied from a baby's chart. Lactation Consultation Note  Patient Name: Marie Mathews Today's Date: 12/23/2017 Reason for consult: Initial assessment;Primapara;Term   Maternal Data    Feeding Feeding Type: Breast Fed Length of feed: 10 min  LATCH Score Latch: Grasps breast easily, tongue down, lips flanged, rhythmical sucking.  Audible Swallowing: A few with stimulation  Type of Nipple: Everted at rest and after stimulation  Comfort (Breast/Nipple): Soft / non-tender  Hold (Positioning): Assistance needed to correctly position infant at breast and maintain latch.  LATCH Score: 8  Interventions Interventions: Breast feeding basics reviewed;Assisted with latch;Breast compression;Skin to skin;Adjust position;Breast massage;Support pillows  Lactation Tools Discussed/Used     Consult Status Consult Status: Follow-up Date: 12/24/17 Follow-up type: In-patient    Huston FoleyMOULDEN, Amberley Hamler S 12/23/2017, 10:05 AM

## 2017-12-23 NOTE — Progress Notes (Signed)
POSTPARTUM PROGRESS NOTE  Post Partum Day #0  Subjective: Marie Mathews is a 22 y.o. [id number] s/p SVD at 599w1d.  No acute events overnight.  Pt reports problems with ambulating (numbness of the right upper thigh due to the pain medication, but ambulates with assistance), voiding. She has not had PO intake yet due to concern for nausea. She reports nausea but denies vomiting vomiting.  Pain is well controlled.  She has had flatus. She has had bowel movement.  Lochia Small.   Objective: Blood pressure 131/85, pulse 67, temperature 98.3 F (36.8 C), temperature source Oral, resp. rate 18, height 5\' 4"  (1.626 m), weight 83.6 kg (184 lb 3.2 oz), SpO2 98 %, unknown if currently breastfeeding.  Physical Exam:  General: Alert, cooperative and no distress Skin: Warm, and dry Heart: Regular rate and rhythm, distal pulses intact Lungs: No respiratory distress, CTAB without wheezing or rales. Abdomen: soft, nontender,  Uterine Fundus: firm, appropriately tender DVT Evaluation: No calf swelling or tenderness Extremities: no edema  Recent Labs    12/22/17 0822  HGB 11.3*  HCT 32.2*    Assessment/Plan: Marie BernMalaya Y Loudermilk is a 22 y.o. [id number] s/p SVD at 499w1d   PPD#0 - Doing well  Contraception: nexplenon  Feeding: breast Dispo: Plan for discharge 12/25/17.   LOS: 1 day   Claudine Moutonyler Chanta Bauers 12/23/2017, 7:41 AM

## 2017-12-23 NOTE — Progress Notes (Signed)
LABOR PROGRESS NOTE  Marie Mathews is a 22 y.o. G2P0100 at 3229w0d  admitted for SOL with SROM.  Subjective: Patient has had better pain control, but still feeling her contractions and significant pressure.   Objective: BP 139/88   Pulse 90   Temp 98.2 F (36.8 C) (Oral)   Resp 18   Ht 5\' 4"  (1.626 m)   Wt 184 lb 3.2 oz (83.6 kg)   LMP  (Approximate)   BMI 31.62 kg/m  or  Vitals:   12/22/17 2301 12/22/17 2317 12/22/17 2331 12/23/17 0000  BP: 122/80  115/82 139/88  Pulse: 71  78 90  Resp: 18  18 18   Temp:  98.2 F (36.8 C)    TempSrc:  Oral    Weight:      Height:        SVE 9 cm/90%/0, persistent edematous anterior lip.  FHT: baseline rate 150, moderate varibility, + acel, rare variable and early decels Toco: MVUs 160s-180s   Assessment / Plan: 22 y.o. G2P0100 at 4229w0d here for SOL with SROM around 0850 today.  Labor:  Making cervical change with Pitocin.  Fetal Wellbeing:  Cat II Pain Control:  Epidural Anticipated MOD: SVD  Frederik PearJulie P Degele, MD 12/23/2017, 12:29 AM

## 2017-12-23 NOTE — Lactation Note (Signed)
This note was copied from a baby's chart. Lactation Consultation Note  Patient Name: Marie Mathews Today's Date: 12/23/2017 Reason for consult: Initial assessment;Primapara;Term Breastfeeding consultation services and support information given and reviewed.  This is mom's first baby and newborn is 177 hours old.  Mom states nipple looked pinched with last feeding.  Assisted with positioning baby in football hold on right breast.  Mom shown how to wait for wide open mouth and bring baby quickly to breast.  Baby latched easily and obtained depth.  Mom comfortable after initial latch on pain.  Observed actively sucking/swallows. Reviewed basics and answered questions.  Encouraged to call with concerns/assist.  Maternal Data    Feeding Feeding Type: Breast Fed Length of feed: 10 min  LATCH Score Latch: Grasps breast easily, tongue down, lips flanged, rhythmical sucking.  Audible Swallowing: A few with stimulation  Type of Nipple: Everted at rest and after stimulation  Comfort (Breast/Nipple): Soft / non-tender  Hold (Positioning): Assistance needed to correctly position infant at breast and maintain latch.  LATCH Score: 8  Interventions Interventions: Breast feeding basics reviewed;Assisted with latch;Breast compression;Skin to skin;Adjust position;Breast massage;Support pillows  Lactation Tools Discussed/Used     Consult Status Consult Status: Follow-up Date: 12/24/17 Follow-up type: In-patient    Huston FoleyMOULDEN, Karstyn Birkey S 12/23/2017, 9:58 AM

## 2017-12-23 NOTE — Plan of Care (Signed)
  Progressing Education: Knowledge of condition will improve 12/23/2017 1758 - Progressing by Princess PernaBurgess, Kojo Liby D, RN Activity: Ability to tolerate increased activity will improve 12/23/2017 1758 - Progressing by Princess PernaBurgess, Dimitri Dsouza D, RN Note Pt ambulates in room without difficulty.  Pt has been encouraged to ambulate in hallway. Role Relationship: Ability to demonstrate positive interaction with newborn will improve 12/23/2017 1758 - Progressing by Princess PernaBurgess, Yaritzel Stange D, RN Note Pt very engaged in care of infant as evidenced by holding infant skin to skin & asking questions.

## 2017-12-24 MED ORDER — IBUPROFEN 600 MG PO TABS: 600.0000 mg | ORAL_TABLET | Freq: Four times a day (QID) | ORAL | 0 refills | Status: DC

## 2017-12-24 NOTE — Lactation Note (Signed)
This note was copied from a baby's chart. Lactation Consultation Note  Patient Name: Girl Pennie BanterMalaya Leece Today's Date: 12/24/2017 Reason for consult: 1st time breastfeeding;Primapara;Term;Follow-up assessment  40 hours old female who is still being BF but mom (per grandma's pressure) already requested formula. Educated mom about risks of formula feeding. Mom is concerned that baby is not getting enough, that's why she was doing 45-60 minutes feedings. Per mom nipples are sore, they show tiny scabs on the end but no noticeable trauma.  Educated mom about the size of baby's stomach; but she still requested formula. Taught mom how to hand express and also discussed engorgement prevention and treatment. Mom is aware of LC services and will call PRN.  Feeding Feeding Type: Breast Fed Length of feed: 60 min   Interventions Interventions: Breast feeding basics reviewed;Breast massage;Breast compression;Hand express;Expressed milk  Lactation Tools Discussed/Used     Consult Status Consult Status: PRN Follow-up type: In-patient    Beauden Tremont Venetia ConstableS Elian Gloster 12/24/2017, 6:08 PM

## 2017-12-24 NOTE — Discharge Summary (Signed)
OB Discharge Summary     Patient Name: Marie Mathews DOB: 1996/01/27 MRN: [id number]  Date of admission: 12/22/2017 Delivering MD: Marie Mathews   Date of discharge: 12/24/2017  Admitting diagnosis: 21 WK WATER BROKE Intrauterine pregnancy: [redacted]w[redacted]d     Secondary diagnosis:  Principal Problem:   SVD (spontaneous vaginal delivery) Active Problems:   Supervision of other normal pregnancy, antepartum   Rh negative state in antepartum period   Limited prenatal care   Sickle cell trait (HCC)   Normal labor  Additional problems: limited PNC, Chlamydia, Hx of PTD, SS trait, Rh negative     Discharge diagnosis: Term Pregnancy Delivered                                                                                                Post partum procedures:none  Augmentation: Pitocin  Complications: None  Hospital course:  Induction of Labor With Vaginal Delivery   22 y.o. yo G2P1101 at [redacted]w[redacted]d was admitted to the hospital 12/22/2017 for induction of labor.  Indication for induction: PROM.  Patient had an uncomplicated labor course as follows: Membrane Rupture Time/Date: 8:50 AM ,12/22/2017   Intrapartum Procedures: Episiotomy: None [1]                                         Lacerations:  None [1]  Patient had delivery of a Viable infant.  Information for the patient's newborn:  Marie, [place] Girl Mathews [[id number]]  Delivery Method: Vag-Spont   12/23/2017  Details of delivery can be found in separate delivery note.  Patient had a routine postpartum course. Patient is discharged home 12/24/17.  Physical exam  Vitals:   12/23/17 0522 12/23/17 0845 12/23/17 1649 12/24/17 0500  BP: 131/85 112/67 121/75 117/77  Pulse: 67 (!) 57 60 61  Resp: 18 16 16 18   Temp: 98.3 F (36.8 C) 98.5 F (36.9 C) 98.2 F (36.8 C) 98 F (36.7 C)  TempSrc: Oral Oral Oral Oral  SpO2:  98% 100%   Weight:      Height:       General: alert, cooperative and no distress Lochia: appropriate Uterine Fundus:  firm Incision: N/A DVT Evaluation: No evidence of DVT seen on physical exam. Negative Homan's sign. No cords or calf tenderness. Labs: Lab Results  Component Value Date   WBC 9.3 12/22/2017   HGB 11.3 (L) 12/22/2017   HCT 32.2 (L) 12/22/2017   MCV 79.3 12/22/2017   PLT 265 12/22/2017   CMP Latest Ref Rng & Units 09/23/2015  Glucose 65 - 99 mg/dL 782(N)  BUN 6 - 20 mg/dL 6  Creatinine 5.62 - 1.30 mg/dL 8.65  Sodium 784 - 696 mmol/L 138  Potassium 3.5 - 5.1 mmol/L 3.2(L)  Chloride 101 - 111 mmol/L 104  CO2 22 - 32 mmol/L 28  Calcium 8.9 - 10.3 mg/dL 9.2  Total Protein 6.5 - 8.1 g/dL 7.1  Total Bilirubin 0.3 - 1.2 mg/dL 2.9(B)  Alkaline Phos 38 - 126 U/L 56  AST 15 - 41 U/L 16  ALT 14 - 54 U/L 10(L)    Discharge instruction: per After Visit Summary and "Baby and Me Booklet".  After visit meds:  Allergies as of 12/24/2017      Reactions   Bee Venom Anaphylaxis      Medication List    STOP taking these medications   ferrous sulfate 325 (65 FE) MG tablet   metroNIDAZOLE 500 MG tablet Commonly known as:  FLAGYL     TAKE these medications   ibuprofen 600 MG tablet Commonly known as:  ADVIL,MOTRIN Take 1 tablet (600 mg total) by mouth every 6 (six) hours.   Prenatal Vitamins 0.8 MG tablet Take 1 tablet by mouth daily.       Diet: routine diet  Activity: Advance as tolerated. Pelvic rest for 6 weeks.   Outpatient follow up:6 weeks Follow up Appt:No future appointments. Follow up Visit:No Follow-up on file.  Postpartum contraception: Nexplanon  Newborn Data: Live born female  Birth Weight: 8 lb 2.9 oz (3711 g) APGAR: 8, 9  Newborn Delivery   Birth date/time:  12/23/2017 02:08:00 Delivery type:  Vaginal, Spontaneous     Baby Feeding: Breast Disposition:home with mother   12/24/2017 Marie Moutonyler Mathews, Student-PA  Midwife attestation I have seen and examined this patient and agree with above documentation in the student's note.   Marie Mathews is  a 22 y.o. G2P1101 s/p SVD.   Pain is well controlled.  Plan for birth control is Nexplanon.  Method of Feeding: breast  PE:  Gen: well appearing Heart: reg rate Lungs: normal WOB Fundus firm Ext: soft, no pain, no edema  Recent Labs    12/22/17 0822  HGB 11.3*  HCT 32.2*     Assessment - PPD #1   Plan: - discharge today - postpartum care discussed - f/u clinic in 6 weeks for postpartum visit   Marie Mathews, CNM 7:49 AM

## 2017-12-24 NOTE — Progress Notes (Deleted)
POSTPARTUM PROGRESS NOTE  Post Partum Day #1  Subjective: Marie Mathews is a 22 y.o. [id number] s/p SVD at 1641w1d.  No acute events overnight.  Pt denies problems with ambulating, voiding or po intake.  She denies nausea or vomiting.  Pain is well controlled.  She has had flatus. She has not had bowel movement.  Lochia Small.   Objective: Blood pressure 117/77, pulse 61, temperature 98 F (36.7 C), temperature source Oral, resp. rate 18, height 5\' 4"  (1.626 m), weight 83.6 kg (184 lb 3.2 oz), SpO2 100 %, unknown if currently breastfeeding.  Physical Exam:  General: Alert, cooperative and no distress Skin: Warm, and dry Heart: Regular rate and rhythm, distal pulses intact Lungs: No respiratory distress, CTAB without wheezing or rales. Abdomen: soft, nontender,  Uterine Fundus: firm, appropriately tender DVT Evaluation: No calf swelling or tenderness Extremities: trace BL edema  Recent Labs    12/22/17 0822  HGB 11.3*  HCT 32.2*    Assessment/Plan: Marie Mathews is a 22 y.o. [id number] s/p SVD at 4841w1d   PPD#1 - Doing well  Contraception: nexplenon  Feeding: breast Dispo: Plan for discharge 12/25/17.   LOS: 2 days   Claudine Moutonyler Chalese Peach 12/24/2017, 6:44 AM

## 2017-12-25 NOTE — Discharge Instructions (Signed)
Please call your OB office and inform them of which birth control option you would like before your follow up appointment  Home Care Instructions for Mom ACTIVITY  Gradually return to your regular activities.  Let yourself rest. Nap while your baby sleeps.  Avoid lifting anything that is heavier than 10 lb (4.5 kg) until your health care provider says it is okay.  Avoid activities that take a lot of effort and energy (are strenuous) until approved by your health care provider. Walking at a slow-to-moderate pace is usually safe.  If you had a cesarean delivery: ? Do not vacuum, climb stairs, or drive a car for 4-6 weeks. ? Have someone help you at home until you feel like you can do your usual activities yourself. ? Do exercises as told by your health care provider, if this applies.  VAGINAL BLEEDING You may continue to bleed for 4-6 weeks after delivery. Over time, the amount of blood usually decreases and the color of the blood usually gets lighter. However, the flow of bright red blood may increase if you have been too active. If you need to use more than one pad in an hour because your pad gets soaked, or if you pass a large clot:  Lie down.  Raise your feet.  Place a cold compress on your lower abdomen.  Rest.  Call your health care provider.  If you are breastfeeding, your period should return anytime between 8 weeks after delivery and the time that you stop breastfeeding. If you are not breastfeeding, your period should return 6-8 weeks after delivery. PERINEAL CARE The perineal area, or perineum, is the part of your body between your thighs. After delivery, this area needs special care. Follow these instructions as told by your health care provider.  Take warm tub baths for 15-20 minutes.  Use medicated pads and pain-relieving sprays and creams as told.  Do not use tampons or douches until vaginal bleeding has stopped.  Each time you go to the bathroom: ? Use a peri  bottle. ? Change your pad. ? Use towelettes in place of toilet paper until your stitches have healed.  Do Kegel exercises every day. Kegel exercises help to maintain the muscles that support the vagina, bladder, and bowels. You can do these exercises while you are standing, sitting, or lying down. To do Kegel exercises: ? Tighten the muscles of your abdomen and the muscles that surround your birth canal. ? Hold for a few seconds. ? Relax. ? Repeat until you have done this 5 times in a row.  To prevent hemorrhoids from developing or getting worse: ? Drink enough fluid to keep your urine clear or pale yellow. ? Avoid straining when having a bowel movement. ? Take over-the-counter medicines and stool softeners as told by your health care provider.  BREAST CARE  Wear a tight-fitting bra.  Avoid taking over-the-counter pain medicine for breast discomfort.  Apply ice to the breasts to help with discomfort as needed: ? Put ice in a plastic bag. ? Place a towel between your skin and the bag. ? Leave the ice on for 20 minutes or as told by your health care provider.  NUTRITION  Eat a well-balanced diet.  Do not try to lose weight quickly by cutting back on calories.  Take your prenatal vitamins until your postpartum checkup or until your health care provider tells you to stop.  POSTPARTUM DEPRESSION You may find yourself crying for no apparent reason and unable to cope with  all of the changes that come with having a newborn. This mood is called postpartum depression. Postpartum depression happens because your hormone levels change after delivery. If you have postpartum depression, get support from your partner, friends, and family. If the depression does not go away on its own after several weeks, contact your health care provider. BREAST SELF-EXAM Do a breast self-exam each month, at the same time of the month. If you are breastfeeding, check your breasts just after a feeding, when your  breasts are less full. If you are breastfeeding and your period has started, check your breasts on day 5, 6, or 7 of your period. Report any lumps, bumps, or discharge to your health care provider. Know that breasts are normally lumpy if you are breastfeeding. This is temporary, and it is not a health risk. INTIMACY AND SEXUALITY Avoid sexual activity for at least 3-4 weeks after delivery or until the brownish-red vaginal flow is completely gone. If you want to avoid pregnancy, use some form of birth control. You can get pregnant after delivery, even if you have not had your period. SEEK MEDICAL CARE IF:  You feel unable to cope with the changes that a child brings to your life, and these feelings do not go away after several weeks.  You notice a lump, a bump, or discharge on your breast.  SEEK IMMEDIATE MEDICAL CARE IF:  Blood soaks your pad in 1 hour or less.  You have: ? Severe pain or cramping in your lower abdomen. ? A bad-smelling vaginal discharge. ? A fever that is not controlled by medicine. ? A fever, and an area of your breast is red and sore. ? Pain or redness in your calf. ? Sudden, severe chest pain. ? Shortness of breath. ? Painful or bloody urination. ? Problems with your vision.  You vomit for 12 hours or longer.  You develop a severe headache.  You have serious thoughts about hurting yourself, your child, or anyone else.  This information is not intended to replace advice given to you by your health care provider. Make sure you discuss any questions you have with your health care provider. Document Released: 10/26/2000 Document Revised: 04/05/2016 Document Reviewed: 05/02/2015 Elsevier Interactive Patient Education  2017 [employer].

## 2017-12-25 NOTE — Lactation Note (Signed)
This note was copied from a baby's chart. Lactation Consultation Note  Patient Name: Girl Pennie BanterMalaya Wissinger [id number]'s Date: 12/25/2017 Reason for consult: Follow-up assessment Mom states breasts are fuller.  Baby is 56 hours and at a 7% weight loss.  Observed mom latch baby to breast using football hold.  Good depth achieved.  Transitional milk easily expressed.  Discussed milk coming to volume and engorgement treatment.  Lactation outpatient services reviewed and encouraged prn.  Mom interested in BF support group.  Maternal Data    Feeding Feeding Type: Breast Fed  LATCH Score Latch: Grasps breast easily, tongue down, lips flanged, rhythmical sucking.  Audible Swallowing: A few with stimulation  Type of Nipple: Everted at rest and after stimulation  Comfort (Breast/Nipple): Soft / non-tender  Hold (Positioning): No assistance needed to correctly position infant at breast.  LATCH Score: 9  Interventions    Lactation Tools Discussed/Used     Consult Status Consult Status: Complete    Huston FoleyMOULDEN, Marilla Boddy S 12/25/2017, 11:01 AM

## 2017-12-25 NOTE — Discharge Summary (Signed)
OB Discharge Summary     Patient Name: Marie Mathews DOB: December 07, 1995 MRN: [id number]  Date of admission: 12/22/2017 Delivering MD: Frederik Pear   Date of discharge: 12/25/2017  Admitting diagnosis: 38 WK WATER BROKE Intrauterine pregnancy: [redacted]w[redacted]d     Secondary diagnosis:  Principal Problem:   SVD (spontaneous vaginal delivery) Active Problems:   Supervision of other normal pregnancy, antepartum   Rh negative state in antepartum period   Limited prenatal care   Sickle cell trait (HCC)   Normal labor  Additional problems: h/o neonatal death      Discharge diagnosis: Term Pregnancy Delivered                                                                                                Post partum procedures:none  Augmentation: Pitocin  Complications: None  Hospital course:  Onset of Labor With Vaginal Delivery     22 y.o. yo G2P1101 at [redacted]w[redacted]d was admitted in Active Labor on 12/22/2017. Patient had an uncomplicated labor course as follows:  Membrane Rupture Time/Date: 8:50 AM ,12/22/2017   Intrapartum Procedures: Episiotomy: None [1]                                         Lacerations:  None [1]  Patient had a delivery of a Viable infant. 12/23/2017  Information for the patient's newborn:  Darriana, Deboy Girl [place] [[id number]]  Delivery Method: Vag-Spont    Pateint had an uncomplicated postpartum course.  She is ambulating, tolerating a regular diet, passing flatus, and urinating well. Patient is discharged home in stable condition on 12/25/17.   Physical exam  Vitals:   12/24/17 0500 12/24/17 1110 12/24/17 1744 12/25/17 0519  BP: 117/77 (!) 126/95 121/75 125/72  Pulse: 61 (!) 59 (!) 54 (!) 54  Resp: 18 18 18 16   Temp: 98 F (36.7 C) 98.4 F (36.9 C) 98.2 F (36.8 C) 98 F (36.7 C)  TempSrc: Oral Oral Oral Oral  SpO2:      Weight:      Height:       General: alert, cooperative and no distress Lochia: appropriate Uterine Fundus: firm Incision: N/A DVT  Evaluation: No evidence of DVT seen on physical exam. No cords or calf tenderness. No significant calf/ankle edema. Labs: Lab Results  Component Value Date   WBC 9.3 12/22/2017   HGB 11.3 (L) 12/22/2017   HCT 32.2 (L) 12/22/2017   MCV 79.3 12/22/2017   PLT 265 12/22/2017   CMP Latest Ref Rng & Units 09/23/2015  Glucose 65 - 99 mg/dL 782(N)  BUN 6 - 20 mg/dL 6  Creatinine 5.62 - 1.30 mg/dL 8.65  Sodium 784 - 696 mmol/L 138  Potassium 3.5 - 5.1 mmol/L 3.2(L)  Chloride 101 - 111 mmol/L 104  CO2 22 - 32 mmol/L 28  Calcium 8.9 - 10.3 mg/dL 9.2  Total Protein 6.5 - 8.1 g/dL 7.1  Total Bilirubin 0.3 - 1.2 mg/dL 2.9(B)  Alkaline Phos 38 - 126 U/L 56  AST 15 - 41 U/L 16  ALT 14 - 54 U/L 10(L)    Discharge instruction: per After Visit Summary and "Baby and Me Booklet".  After visit meds:  Allergies as of 12/25/2017      Reactions   Bee Venom Anaphylaxis      Medication List    STOP taking these medications   ferrous sulfate 325 (65 FE) MG tablet   metroNIDAZOLE 500 MG tablet Commonly known as:  FLAGYL     TAKE these medications   ibuprofen 600 MG tablet Commonly known as:  ADVIL,MOTRIN Take 1 tablet (600 mg total) by mouth every 6 (six) hours.   Prenatal Vitamins 0.8 MG tablet Take 1 tablet by mouth daily.       Diet: routine diet  Activity: Advance as tolerated. Pelvic rest for 6 weeks.   Outpatient follow up:4 weeks Follow up Appt: Future Appointments  Date Time Provider Department Center  02/05/2018  9:55 AM Marny LowensteinWenzel, Julie N, PA-C [facility]   Follow up Visit:No Follow-up on file.  Postpartum contraception: Undecided, previously wanted Nexplanon  Newborn Data: Live born female  Birth Weight: 8 lb 2.9 oz (3711 g) APGAR: 8, 9  Newborn Delivery   Birth date/time:  12/23/2017 02:08:00 Delivery type:  Vaginal, Spontaneous     Baby Feeding: Breast Disposition:home with mother   12/25/2017 Marie ManisSherin Abraham, DO  PGY-1  I have seen this patient and  agree with the above resident's note. I discussed LARCs with pt today and [facility]  Marie Mathews, Marie Mathews Certified Nurse-Midwife

## 2017-12-25 NOTE — Progress Notes (Signed)
CSW acknowledges consult for limited PNC.  CSW completed chart review and noted MOB [facility] [place]  prior to moving to [place] (MOB had more than 3 PNC visits).   CSW screening out referral at this time since MOB had more than 3 PNC visits and there are no psychosocial stressors that require CSW evaluation.  Re-consult if needs arise, by MOB request, or if MOB scores greater than 9/yes to question 10 on Edinburgh Postpartum Depression Screen.  Tu Shimmel Boyd-Gilyard, MSW, LCSW Clinical Social Work [phone]   

## 2017-12-26 ENCOUNTER — Encounter: Payer: Medicaid Other | Admitting: Obstetrics and Gynecology

## 2017-12-26 LAB — TYPE AND SCREEN
ABO/RH(D): O NEG
Antibody Screen: POSITIVE
UNIT DIVISION: 0
Unit division: 0

## 2017-12-26 LAB — BPAM RBC
Blood Product Expiration Date: 201902252359
Blood Product Expiration Date: 201903192359
Unit Type and Rh: 9500
Unit Type and Rh: 9500

## 2018-02-05 ENCOUNTER — Ambulatory Visit (INDEPENDENT_AMBULATORY_CARE_PROVIDER_SITE_OTHER): Payer: Medicaid Other | Admitting: Medical

## 2018-02-05 ENCOUNTER — Encounter: Payer: Self-pay | Admitting: Medical

## 2018-02-05 DIAGNOSIS — Z3049 Encounter for surveillance of other contraceptives: Secondary | ICD-10-CM | POA: Diagnosis not present

## 2018-02-05 DIAGNOSIS — Z1389 Encounter for screening for other disorder: Secondary | ICD-10-CM | POA: Diagnosis not present

## 2018-02-05 DIAGNOSIS — Z8751 Personal history of pre-term labor: Secondary | ICD-10-CM | POA: Insufficient documentation

## 2018-02-05 DIAGNOSIS — Z30017 Encounter for initial prescription of implantable subdermal contraceptive: Secondary | ICD-10-CM

## 2018-02-05 LAB — POCT PREGNANCY, URINE: Preg Test, Ur: NEGATIVE

## 2018-02-05 MED ORDER — ETONOGESTREL 68 MG ~~LOC~~ IMPL
68.0000 mg | DRUG_IMPLANT | Freq: Once | SUBCUTANEOUS | Status: AC
  Administered 2018-02-05: 68 mg via SUBCUTANEOUS

## 2018-02-05 NOTE — Patient Instructions (Signed)
Etonogestrel implant What is this medicine? ETONOGESTREL (et oh noe JES trel) is a contraceptive (birth control) device. It is used to prevent pregnancy. It can be used for up to 3 years. This medicine may be used for other purposes; ask your health care provider or pharmacist if you have questions. COMMON BRAND NAME(S): Implanon, Nexplanon What should I tell my health care provider before I take this medicine? They need to know if you have any of these conditions: -abnormal vaginal bleeding -blood vessel disease or blood clots -cancer of the breast, cervix, or liver -depression -diabetes -gallbladder disease -headaches -heart disease or recent heart attack -high blood pressure -high cholesterol -kidney disease -liver disease -renal disease -seizures -tobacco smoker -an unusual or allergic reaction to etonogestrel, other hormones, anesthetics or antiseptics, medicines, foods, dyes, or preservatives -pregnant or trying to get pregnant -breast-feeding How should I use this medicine? This device is inserted just under the skin on the inner side of your upper arm by a health care professional. Talk to your pediatrician regarding the use of this medicine in children. Special care may be needed. Overdosage: If you think you have taken too much of this medicine contact a poison control center or emergency room at once. NOTE: This medicine is only for you. Do not share this medicine with others. What if I miss a dose? This does not apply. What may interact with this medicine? Do not take this medicine with any of the following medications: -amprenavir -bosentan -fosamprenavir This medicine may also interact with the following medications: -barbiturate medicines for inducing sleep or treating seizures -certain medicines for fungal infections like ketoconazole and itraconazole -grapefruit juice -griseofulvin -medicines to treat seizures like carbamazepine, felbamate, oxcarbazepine,  phenytoin, topiramate -modafinil -phenylbutazone -rifampin -rufinamide -some medicines to treat HIV infection like atazanavir, indinavir, lopinavir, nelfinavir, tipranavir, ritonavir -St. John's wort This list may not describe all possible interactions. Give your health care provider a list of all the medicines, herbs, non-prescription drugs, or dietary supplements you use. Also tell them if you smoke, drink alcohol, or use illegal drugs. Some items may interact with your medicine. What should I watch for while using this medicine? This product does not protect you against HIV infection (AIDS) or other sexually transmitted diseases. You should be able to feel the implant by pressing your fingertips over the skin where it was inserted. Contact your doctor if you cannot feel the implant, and use a non-hormonal birth control method (such as condoms) until your doctor confirms that the implant is in place. If you feel that the implant may have broken or become bent while in your arm, contact your healthcare provider. What side effects may I notice from receiving this medicine? Side effects that you should report to your doctor or health care professional as soon as possible: -allergic reactions like skin rash, itching or hives, swelling of the face, lips, or tongue -breast lumps -changes in emotions or moods -depressed mood -heavy or prolonged menstrual bleeding -pain, irritation, swelling, or bruising at the insertion site -scar at site of insertion -signs of infection at the insertion site such as fever, and skin redness, pain or discharge -signs of pregnancy -signs and symptoms of a blood clot such as breathing problems; changes in vision; chest pain; severe, sudden headache; pain, swelling, warmth in the leg; trouble speaking; sudden numbness or weakness of the face, arm or leg -signs and symptoms of liver injury like dark yellow or brown urine; general ill feeling or flu-like symptoms;  light-colored   stools; loss of appetite; nausea; right upper belly pain; unusually weak or tired; yellowing of the eyes or skin -unusual vaginal bleeding, discharge -signs and symptoms of a stroke like changes in vision; confusion; trouble speaking or understanding; severe headaches; sudden numbness or weakness of the face, arm or leg; trouble walking; dizziness; loss of balance or coordination Side effects that usually do not require medical attention (report to your doctor or health care professional if they continue or are bothersome): -acne -back pain -breast pain -changes in weight -dizziness -general ill feeling or flu-like symptoms -headache -irregular menstrual bleeding -nausea -sore throat -vaginal irritation or inflammation This list may not describe all possible side effects. Call your doctor for medical advice about side effects. You may report side effects to FDA at 1-800-FDA-1088. Where should I keep my medicine? This drug is given in a hospital or clinic and will not be stored at home. NOTE: This sheet is a summary. It may not cover all possible information. If you have questions about this medicine, talk to your doctor, pharmacist, or health care provider.  2018 Elsevier/Gold Standard (2016-05-17 11:19:22) Nexplanon Instructions After Insertion   Keep bandage clean and dry for 24 hours   May use ice/Tylenol/Ibuprofen for soreness or pain   If you develop fever, drainage or increased warmth from incision site-contact office immediately   

## 2018-02-05 NOTE — Progress Notes (Signed)
Subjective:     Marie Mathews is a 22 y.o. female who presents for a postpartum visit. She is 6 weeks postpartum following a spontaneous vaginal delivery. I have fully reviewed the prenatal and intrapartum course. The delivery was at 39.1 gestational weeks. Outcome: spontaneous vaginal delivery. Anesthesia: epidural. Postpartum course has been normal. Baby's course has been normal. Baby is feeding by bottle - [employer] Organic Sensitive Formula. Bleeding no bleeding. Bowel function is normal. Bladder function is normal. Patient is not sexually active. Contraception method is abstinence. Postpartum depression screening: negative.  The following portions of the patient's history were reviewed and updated as appropriate: allergies, current medications, past family history, past medical history, past social history, past surgical history and problem list.  Review of Systems Pertinent items are noted in HPI.   Objective:    BP 113/69   Pulse 71   Ht  (1.626 m)   Wt 166 lb 8 oz (75.5 kg)   Breastfeeding? No   BMI 28.58 kg/m   General:  alert and cooperative   Breasts:  deferred  Lungs: clear to auscultation bilaterally  Heart:  regular rate and rhythm, S1, S2 normal, no murmur, click, rub or gallop  Abdomen: soft, non-tender; bowel sounds normal; no masses,  no organomegaly   Vulva:  not evaluated  Vagina: not evaluated  Cervix:  not evaluated  Corpus: not examined  Adnexa:  not evaluated  Rectal Exam: Not performed.         GYNECOLOGY CLINIC PROCEDURE NOTE  Nexplanon Insertion Procedure Patient was given informed consent, she signed consent form.  Patient does understand that irregular bleeding is a very common side effect of this medication. She was advised to have backup contraception for one week after placement. Pregnancy test in clinic today was negative.  Appropriate time out taken.  Patient's left arm was prepped and draped in the usual sterile fashion.. The ruler used  to measure and mark insertion area.  Patient was prepped with alcohol swab and then injected with 3 ml of 1% lidocaine.  She was prepped with betadine, Nexplanon removed from packaging,  Device confirmed in needle, then inserted full length of needle and withdrawn per handbook instructions. Nexplanon was able to palpated in the patient's arm; patient palpated the insert herself. There was minimal blood loss.  Patient insertion site covered with guaze and a pressure bandage to reduce any bruising.  The patient tolerated the procedure well and was given post procedure instructions.   Assessment:     Normal postpartum exam. Pap smear not done at today's visit. Will need first pap smear at 22 years old.  Nexplanon insertion  Plan:    1. Contraception: Nexplanon 2. Condom use encouraged for at least 1 week after insertion as back-up 3. Follow up in: 8 months, after 22st birthday for annual exam and pap smear or sooner as needed.    Marny Lowenstein, PA-C 02/05/2018 10:39 AM

## 2018-02-05 NOTE — Progress Notes (Signed)
Pt wants the Nexplanon

## 2018-02-05 NOTE — Addendum Note (Signed)
Addended by: Henrietta DineNEAL, Donnette Macmullen S on: 02/05/2018 10:58 AM   Modules accepted: Orders

## 2018-02-10 ENCOUNTER — Encounter: Payer: Self-pay | Admitting: *Deleted

## 2018-02-11 ENCOUNTER — Telehealth: Payer: Self-pay | Admitting: General Practice

## 2018-02-11 NOTE — Telephone Encounter (Signed)
Patient called and left message on nurse line stating she had the nexplanon inserted recently and is having bruising below where it was inserted. Patient is calling to find out if this is normal or not. Called patient and discussed bruising and soreness after insertion is normal. Patient verbalized understanding to all & had no questions

## 2018-06-04 ENCOUNTER — Encounter (HOSPITAL_COMMUNITY): Payer: Self-pay

## 2018-06-04 ENCOUNTER — Ambulatory Visit (HOSPITAL_COMMUNITY)
Admission: EM | Admit: 2018-06-04 | Discharge: 2018-06-04 | Disposition: A | Discharge status: Home / Self Care | Payer: Medicaid Other | Attending: Family Medicine | Admitting: Family Medicine

## 2018-06-04 DIAGNOSIS — N898 Other specified noninflammatory disorders of vagina: Secondary | ICD-10-CM | POA: Diagnosis not present

## 2018-06-04 MED ORDER — AZITHROMYCIN 250 MG PO TABS
ORAL_TABLET | ORAL | Status: AC
Start: 2018-06-04 — End: 2018-06-04
  Filled 2018-06-04: qty 4

## 2018-06-04 MED ORDER — CEFTRIAXONE SODIUM 250 MG IJ SOLR
250.0000 mg | Freq: Once | INTRAMUSCULAR | Status: AC
  Administered 2018-06-04: 250 mg via INTRAMUSCULAR

## 2018-06-04 MED ORDER — AZITHROMYCIN 250 MG PO TABS
1000.0000 mg | ORAL_TABLET | Freq: Once | ORAL | Status: AC
  Administered 2018-06-04: 1000 mg via ORAL

## 2018-06-04 MED ORDER — CEFTRIAXONE SODIUM 250 MG IJ SOLR
INTRAMUSCULAR | Status: AC
  Filled 2018-06-04: qty 250

## 2018-06-04 MED ORDER — LIDOCAINE HCL (PF) 1 % IJ SOLN
INTRAMUSCULAR | Status: AC
Start: 2018-06-04 — End: 2018-06-04
  Filled 2018-06-04: qty 2

## 2018-06-04 NOTE — Discharge Instructions (Signed)
Given rocephin 250mg  injection and azithromycin 1g in office Vaginal self-swab obtained Declines HIV/ syphilis testing today We will follow up with you regarding the results of your test If tests are positive, please abstain from sexual activity for at least 7 days and notify partners Follow up with PCP if symptoms persists Return here or go to ER if you have any new or worsening symptoms

## 2018-06-04 NOTE — ED Provider Notes (Signed)
[facility]   [id number] 06/04/18 Arrival Time: 1842   SUBJECTIVE:  Marie Mathews is a 22 y.o. female who presents with complaints of vaginal discharge and odor that began 3 days ago.  Patient states she recently finished her period when her symptoms began. Last unprotected sex a few weeks ago.  Partner without symptoms.  Patient is sexually active with 1 female partner.  Describes discharge as thin and off-white.  She has NOT tried OTC medications.  She denies worsening symptoms.  Denies similar symptoms in the past.  She denies fever, chills, nausea, vomiting, abdominal or pelvic pain, urinary symptoms, vaginal itching, vaginal bleeding, dyspareunia, vaginal rashes or lesions.   No LMP recorded (lmp unknown). Patient has had an implant.  ROS: As per HPI.  History reviewed. No pertinent past medical history. History reviewed. No pertinent surgical history. Allergies  Allergen Reactions  . Bee Venom Anaphylaxis   No current facility-administered medications on file prior to encounter.    Current Outpatient Medications on File Prior to Encounter  Medication Sig Dispense Refill  . ibuprofen (ADVIL,MOTRIN) 600 MG tablet Take 1 tablet (600 mg total) by mouth every 6 (six) hours. 30 tablet 0  . Prenatal Multivit-Min-Fe-FA (PRENATAL VITAMINS) 0.8 MG tablet Take 1 tablet by mouth daily. (Patient not taking: Reported on 12/22/2017) 30 tablet 12    Social History   Socioeconomic History  . Marital status: Single    Spouse name: Not on file  . Number of children: Not on file  . Years of education: Not on file  . Highest education level: Not on file  Occupational History  . Not on file  Social Needs  . Financial resource strain: Not on file  . Food insecurity:    Worry: Not on file    Inability: Not on file  . Transportation needs:    Medical: Not on file    Non-medical: Not on file  Tobacco Use  . Smoking status: Never Smoker  . Smokeless tobacco: Never Used    Substance and Sexual Activity  . Alcohol use: No  . Drug use: No  . Sexual activity: Yes    Birth control/protection: None  Lifestyle  . Physical activity:    Days per week: Not on file    Minutes per session: Not on file  . Stress: Not on file  Relationships  . Social connections:    Talks on phone: Not on file    Gets together: Not on file    Attends religious service: Not on file    Active member of club or organization: Not on file    Attends meetings of clubs or organizations: Not on file    Relationship status: Not on file  . Intimate partner violence:    Fear of current or ex partner: Not on file    Emotionally abused: Not on file    Physically abused: Not on file    Forced sexual activity: Not on file  Other Topics Concern  . Not on file  Social History Narrative  . Not on file   History reviewed. No pertinent family history.  OBJECTIVE:  Vitals:   06/04/18 1856  BP: 123/83  Pulse: 89  Resp: 18  Temp: 98.3 F (36.8 C)  TempSrc: Oral  SpO2: 98%     General appearance: alert, cooperative, appears stated age and no distress Throat: lips, mucosa, and tongue normal; teeth and gums normal Lungs: CTA bilaterally without adventitious breath sounds Heart: regular rate and rhythm.  Radial pulses 2+ symmetrical bilaterally Back: no CVA tenderness Abdomen: soft, non-tender; bowel sounds normal; no masses or organomegaly; no guarding or rebound tenderness GU: declines; vaginal self-swab obtained.   Skin: warm and dry Psychological:  Alert and cooperative. Normal mood and affect.  Results for orders placed or performed in visit on 02/05/18  Pregnancy, urine POC  Result Value Ref Range   Preg Test, Ur NEGATIVE NEGATIVE    Labs Reviewed  CERVICOVAGINAL ANCILLARY ONLY    ASSESSMENT & PLAN:  1. Vaginal discharge     Meds ordered this encounter  Medications  . azithromycin (ZITHROMAX) tablet 1,000 mg  . cefTRIAXone (ROCEPHIN) injection 250 mg     Pending: Labs Reviewed  CERVICOVAGINAL ANCILLARY ONLY    Given rocephin 250mg  injection and azithromycin 1g in office Vaginal self-swab obtained Declines HIV/ syphilis testing today We will follow up with you regarding the results of your test If tests are positive, please abstain from sexual activity for at least 7 days and notify partners Follow up with PCP if symptoms persists Return here or go to ER if you have any new or worsening symptoms    Reviewed expectations re: course of current medical issues. Questions answered. Outlined signs and symptoms indicating need for more acute intervention. Patient verbalized understanding. After Visit Summary given.       Rennis HardingWurst, Khyrie Masi, PA-C 06/04/18 2007

## 2018-06-04 NOTE — ED Triage Notes (Signed)
Pt presents with vaginal discharge and odor

## 2018-06-05 LAB — CERVICOVAGINAL ANCILLARY ONLY
Bacterial vaginitis: POSITIVE — AB
CANDIDA VAGINITIS: NEGATIVE
CHLAMYDIA, DNA PROBE: NEGATIVE
Neisseria Gonorrhea: NEGATIVE
TRICH (WINDOWPATH): NEGATIVE

## 2018-06-07 ENCOUNTER — Telehealth (HOSPITAL_COMMUNITY): Payer: Self-pay

## 2018-06-07 MED ORDER — METRONIDAZOLE 500 MG PO TABS: 500.0000 mg | ORAL_TABLET | Freq: Two times a day (BID) | ORAL | 0 refills | Status: DC

## 2018-06-07 NOTE — Telephone Encounter (Signed)
Bacterial vaginosis is positive. This was not treated at the urgent care visit.  Patient complains of persistent symptoms.  Flagyl 500 mg BID x 7 days #14 no refills sent to patients pharmacy of choice per Dr. Murray.  Pt called and made aware of results and new prescription. Answered all questions and pt verbalized understanding.  

## 2018-06-25 ENCOUNTER — Ambulatory Visit: Payer: Medicaid Other | Admitting: Nurse Practitioner

## 2018-07-01 ENCOUNTER — Ambulatory Visit (HOSPITAL_COMMUNITY)
Admission: EM | Admit: 2018-07-01 | Discharge: 2018-07-01 | Disposition: A | Discharge status: Home / Self Care | Payer: Medicaid Other | Attending: Nurse Practitioner | Admitting: Nurse Practitioner

## 2018-07-01 ENCOUNTER — Encounter (HOSPITAL_COMMUNITY): Payer: Self-pay | Admitting: Emergency Medicine

## 2018-07-01 DIAGNOSIS — L239 Allergic contact dermatitis, unspecified cause: Secondary | ICD-10-CM

## 2018-07-01 DIAGNOSIS — L500 Allergic urticaria: Secondary | ICD-10-CM | POA: Diagnosis not present

## 2018-07-01 MED ORDER — DIPHENHYDRAMINE HCL 25 MG PO TABS: 25.0000 mg | ORAL_TABLET | Freq: Four times a day (QID) | ORAL | 0 refills | Status: DC

## 2018-07-01 MED ORDER — METHYLPREDNISOLONE ACETATE 80 MG/ML IJ SUSP
INTRAMUSCULAR | Status: AC
  Filled 2018-07-01: qty 1

## 2018-07-01 MED ORDER — PREDNISONE 20 MG PO TABS: 20.0000 mg | ORAL_TABLET | Freq: Every day | ORAL | 0 refills | Status: AC

## 2018-07-01 MED ORDER — METHYLPREDNISOLONE ACETATE 80 MG/ML IJ SUSP
80.0000 mg | Freq: Once | INTRAMUSCULAR | Status: AC
  Administered 2018-07-01: 80 mg via INTRAMUSCULAR

## 2018-07-01 NOTE — ED Provider Notes (Signed)
[facility]    CSN: [id number] Arrival date & time: 07/01/18  1216     History   Chief Complaint Chief Complaint  Patient presents with  . Rash    HPI Carney BernMalaya Y Gilkison is a 22 y.o. female.   History of Present Illness  Carney BernMalaya Y Seago is a 22 y.o. female that presents for evaluation of hives. Patient awoke this morning with hives throughout her entire body. Patient reports that she is severely itchy. She denies chest pain, shortness of breath, wheezing, throat pain or mouth/tongue swelling. Patient denies similar previous allergic reactions. Patient reports using a new laundry detergent lately but denies any other new exposures. Care prior to arrival consisted of nothing.        History reviewed. No pertinent past medical history.  Patient Active Problem List   Diagnosis Date Noted  . History of preterm delivery 02/05/2018  . Sickle cell trait (HCC) 12/13/2017  . History of neonatal death 12/12/2017  . Blood type, Rh negative 12/12/2017    History reviewed. No pertinent surgical history.  OB History    Gravida  2   Para  2   Term  1   Preterm  1   AB  0   Living  1     SAB  0   TAB      Ectopic      Multiple  0   Live Births  1            Home Medications    Prior to Admission medications   Medication Sig Start Date End Date Taking? Authorizing Provider  diphenhydrAMINE (BENADRYL) 25 MG tablet Take 1 tablet (25 mg total) by mouth every 6 (six) hours. Take 1 tablet by mouth every 6 hours x 24 hours then may take as needed 07/01/18   Lurline IdolMurrill, Keval Nam, FNP  ibuprofen (ADVIL,MOTRIN) 600 MG tablet Take 1 tablet (600 mg total) by mouth every 6 (six) hours. 12/24/17   Donette LarryBhambri, Melanie, CNM  predniSONE (DELTASONE) 20 MG tablet Take 1 tablet (20 mg total) by mouth daily for 5 days. 07/01/18 07/06/18  Lurline IdolMurrill, Kal Chait, FNP    Family History History reviewed. No pertinent family history.  Social History Social History   Tobacco Use    . Smoking status: Never Smoker  . Smokeless tobacco: Never Used  Substance Use Topics  . Alcohol use: No  . Drug use: No     Allergies   Bee venom   Review of Systems Review of Systems  HENT: Negative for trouble swallowing.   Respiratory: Negative for cough, shortness of breath and wheezing.   Skin: Positive for rash.  All other systems reviewed and are negative.    Physical Exam Triage Vital Signs ED Triage Vitals [07/01/18 1230]  Enc Vitals Group     BP 123/74     Pulse Rate 85     Resp 16     Temp 98.4 F (36.9 C)     Temp Source Oral     SpO2 98 %     Weight      Height      Head Circumference      Peak Flow      Pain Score      Pain Loc      Pain Edu?      Excl. in GC?    No data found.  Updated Vital Signs BP 123/74 (BP Location: Right Arm)   Pulse 85   Temp 98.4  F (36.9 C) (Oral)   Resp 16   LMP  (LMP Unknown)   SpO2 98%   Visual Acuity Right Eye Distance:   Left Eye Distance:   Bilateral Distance:    Right Eye Near:   Left Eye Near:    Bilateral Near:     Physical Exam  Constitutional: She is oriented to person, place, and time. She appears well-developed and well-nourished. No distress.  HENT:  Mouth/Throat: Uvula is midline, oropharynx is clear and moist and mucous membranes are normal.  Neck: Normal range of motion. Neck supple.  Cardiovascular: Normal rate and regular rhythm.  Pulmonary/Chest: Effort normal and breath sounds normal.  Musculoskeletal: Normal range of motion.  Neurological: She is alert and oriented to person, place, and time.  Skin: Skin is warm, dry and intact. Rash noted. Rash is urticarial. She is not diaphoretic.  Psychiatric: She has a normal mood and affect.     UC Treatments / Results  Labs (all labs ordered are listed, but only abnormal results are displayed) Labs Reviewed - No data to display  EKG None  Radiology No results found.  Procedures Procedures (including critical care  time)  Medications Ordered in UC Medications  methylPREDNISolone acetate (DEPO-MEDROL) injection 80 mg (80 mg Intramuscular Given 07/01/18 1250)    Initial Impression / Assessment and Plan / UC Course  I have reviewed the triage vital signs and the nursing notes.  Pertinent labs & imaging results that were available during my care of the patient were reviewed by me and considered in my medical decision making (see chart for details).     22 yo female presenting with an acute onset of diffused hives. Symptoms likely due to allergic contact dermatitis secondary to new laundry detergent. Solumedrol 80 mg IM given in clinic. Patient prescribed course of steroids as well as benadryl 25 mg PO every 6 hours x 24 hours then she may take as needed. Patient advised to avoid products with fragrances until the rash resolves. She can follow-up with her PCP or allergist as needed. Discussed indications for immediate ED follow-up  Today's evaluation has revealed no signs of a dangerous process. Discussed diagnosis with patient. Patient aware of their diagnosis, possible red flag symptoms to watch out for and need for close follow up. Patient understands verbal and written discharge instructions. Patient comfortable with plan and disposition.  Patient has a clear mental status at this time, good insight into illness (after discussion and teaching) and has clear judgment to make decisions regarding their care.  Documentation was completed with the aid of voice recognition software. Transcription may contain typographical errors. Final Clinical Impressions(s) / UC Diagnoses   Final diagnoses:  Allergic contact dermatitis, unspecified trigger   Discharge Instructions   None    ED Prescriptions    Medication Sig Dispense Auth. Provider   predniSONE (DELTASONE) 20 MG tablet Take 1 tablet (20 mg total) by mouth daily for 5 days. 5 tablet Lurline IdolMurrill, Jenna Routzahn, FNP   diphenhydrAMINE (BENADRYL) 25 MG tablet Take 1  tablet (25 mg total) by mouth every 6 (six) hours. Take 1 tablet by mouth every 6 hours x 24 hours then may take as needed 20 tablet Lurline IdolMurrill, Tishana Clinkenbeard, FNP     Controlled Substance Prescriptions [place] Controlled Substance Registry consulted? Not Applicable   Lurline IdolMurrill, Giliana Vantil, FNP 07/01/18 1255

## 2018-07-01 NOTE — ED Triage Notes (Signed)
Pt here for rash that is itchy to arms and torso; pt sts woke up with this am

## 2018-08-12 ENCOUNTER — Ambulatory Visit (HOSPITAL_COMMUNITY)
Admission: EM | Admit: 2018-08-12 | Discharge: 2018-08-12 | Disposition: A | Discharge status: Home / Self Care | Payer: Medicaid Other | Attending: Family Medicine | Admitting: Family Medicine

## 2018-08-12 ENCOUNTER — Other Ambulatory Visit: Payer: Self-pay

## 2018-08-12 ENCOUNTER — Encounter (HOSPITAL_COMMUNITY): Payer: Self-pay | Admitting: *Deleted

## 2018-08-12 ENCOUNTER — Ambulatory Visit (INDEPENDENT_AMBULATORY_CARE_PROVIDER_SITE_OTHER): Payer: Medicaid Other

## 2018-08-12 DIAGNOSIS — M25562 Pain in left knee: Secondary | ICD-10-CM | POA: Diagnosis not present

## 2018-08-12 DIAGNOSIS — N760 Acute vaginitis: Secondary | ICD-10-CM | POA: Diagnosis not present

## 2018-08-12 DIAGNOSIS — D573 Sickle-cell trait: Secondary | ICD-10-CM | POA: Insufficient documentation

## 2018-08-12 DIAGNOSIS — N898 Other specified noninflammatory disorders of vagina: Secondary | ICD-10-CM | POA: Diagnosis present

## 2018-08-12 MED ORDER — METRONIDAZOLE 500 MG PO TABS: 500.0000 mg | ORAL_TABLET | Freq: Two times a day (BID) | ORAL | 0 refills | Status: DC

## 2018-08-12 NOTE — Discharge Instructions (Signed)
Your x ray is normal We will try knee sleeve, rest, ice and naproxen for pain.  We sent the swab for testing We will go ahead and treat you for bacterial vaginosis and call with any positive results.  Follow up as needed for continued or worsening symptoms

## 2018-08-12 NOTE — ED Triage Notes (Signed)
C/o left knee pain x 1 week, also c/o vaginal discharge with odor.

## 2018-08-12 NOTE — ED Provider Notes (Signed)
[facility]    CSN: [id number] Arrival date & time: 08/12/18  1253     History   Chief Complaint Chief Complaint  Patient presents with  . Knee Pain  . Vaginal Discharge    HPI Marie Mathews is a 22 y.o. female.   Is a 22 year old female with multiple complaints.  She is here with left knee pain x1 week.  Symptoms have been constant and worsening.  She has had increased pain and swelling in the left knee.  She denies any injury to the knee but she has had increased standing recently.  Denies any prior history of any problems.  Denies any increased warmth in the knee.  She denies any calf pain, increased warmth, swelling or posterior knee swelling.  No fevers, chills, body aches.  She is also having 1 week of vaginal discharge that is yellow and thin consistency.  The discharge has been constant and she has seen it on the menstrual pad.  Her last menstrual period was the beginning of September.  She uses Nexplanon for birth control.  Recent pregnancy back in February.  Reports she is currently sexually active.  Has been using treatments at home to include Epson salt bath and apple cider vinegar.  She denies any associated abdominal pain, back pain, pelvic pain, vaginal bleeding, vaginal itching or irritation, dysuria, hematuria, urinary frequency  ROS per HPI      History reviewed. No pertinent past medical history.  Patient Active Problem List   Diagnosis Date Noted  . History of preterm delivery 02/05/2018  . Sickle cell trait (HCC) 2017-12-21  . History of neonatal death 12/20/2017  . Blood type, Rh negative 2017/12/20    History reviewed. No pertinent surgical history.  OB History    Gravida  2   Para  2   Term  1   Preterm  1   AB  0   Living  1     SAB  0   TAB      Ectopic      Multiple  0   Live Births  1            Home Medications    Prior to Admission medications   Medication Sig Start Date End Date Taking? Authorizing  Provider  diphenhydrAMINE (BENADRYL) 25 MG tablet Take 1 tablet (25 mg total) by mouth every 6 (six) hours. Take 1 tablet by mouth every 6 hours x 24 hours then may take as needed 07/01/18   Lurline Idol, FNP  ibuprofen (ADVIL,MOTRIN) 600 MG tablet Take 1 tablet (600 mg total) by mouth every 6 (six) hours. 12/24/17   Donette Larry, CNM  metroNIDAZOLE (FLAGYL) 500 MG tablet Take 1 tablet (500 mg total) by mouth 2 (two) times daily. 08/12/18   Janace Aris, NP    Family History History reviewed. No pertinent family history.  Social History Social History   Tobacco Use  . Smoking status: Never Smoker  . Smokeless tobacco: Never Used  Substance Use Topics  . Alcohol use: No  . Drug use: No     Allergies   Bee venom   Review of Systems Review of Systems   Physical Exam Triage Vital Signs ED Triage Vitals  Enc Vitals Group     BP 08/12/18 1340 117/62     Pulse Rate 08/12/18 1340 67     Resp 08/12/18 1340 18     Temp 08/12/18 1340 98.5 F (36.9 C)  Temp Source 08/12/18 1340 Oral     SpO2 08/12/18 1340 98 %     Weight --      Height --      Head Circumference --      Peak Flow --      Pain Score 08/12/18 1342 8     Pain Loc --      Pain Edu? --      Excl. in GC? --    No data found.  Updated Vital Signs BP 117/62 (BP Location: Right Arm)   Pulse 67   Temp 98.5 F (36.9 C) (Oral)   Resp 18   SpO2 98%   Visual Acuity Right Eye Distance:   Left Eye Distance:   Bilateral Distance:    Right Eye Near:   Left Eye Near:    Bilateral Near:     Physical Exam  Constitutional: She is oriented to person, place, and time. She appears well-developed and well-nourished.  Very pleasant. Non toxic or ill appearing.     HENT:  Head: Normocephalic and atraumatic.  Eyes: Conjunctivae are normal.  Neck: Normal range of motion.  Pulmonary/Chest: Effort normal.  Abdominal: Soft. Bowel sounds are normal.  Abdomen soft, non tender. No CVA tenderness. No rebound  tenderness.     Musculoskeletal: Normal range of motion.  Pain with hyperextension and flexion past 90 degrees of the left knee.  Mild swelling noted.  Bony tenderness to patella.  He is able to ambulate on the knee in room with mild discomfort. No bruising, erythema or deformities.  Neurological: She is alert and oriented to person, place, and time.  Skin: Skin is warm and dry.  Psychiatric: She has a normal mood and affect.  Nursing note and vitals reviewed.    UC Treatments / Results  Labs (all labs ordered are listed, but only abnormal results are displayed) Labs Reviewed  CERVICOVAGINAL ANCILLARY ONLY    EKG None  Radiology Dg Knee Complete 4 Views Left  Result Date: 08/12/2018 CLINICAL DATA:  Pain and swelling left knee 1 week.  No injury. EXAM: LEFT KNEE - COMPLETE 4+ VIEW COMPARISON:  None. FINDINGS: No evidence of fracture, dislocation, or joint effusion. No evidence of arthropathy or other focal bone abnormality. Soft tissues are unremarkable. IMPRESSION: Negative. Electronically Signed   By: Elberta Fortis M.D.   On: 08/12/2018 14:46    Procedures Procedures (including critical care time)  Medications Ordered in UC Medications - No data to display  Initial Impression / Assessment and Plan / UC Course  I have reviewed the triage vital signs and the nursing notes.  Pertinent labs & imaging results that were available during my care of the patient were reviewed by me and considered in my medical decision making (see chart for details).     X ray negative RICE and ibuprofen flagyl for BV Swab sent for testing.  Lab results pending.   Final Clinical Impressions(s) / UC Diagnoses   Final diagnoses:  Acute pain of left knee  Acute vaginitis     Discharge Instructions     Your x ray is normal We will try knee sleeve, rest, ice and naproxen for pain.  We sent the swab for testing We will go ahead and treat you for bacterial vaginosis and call with any  positive results.  Follow up as needed for continued or worsening symptoms      ED Prescriptions    Medication Sig Dispense Auth. Provider   metroNIDAZOLE (FLAGYL) 500  MG tablet Take 1 tablet (500 mg total) by mouth 2 (two) times daily. 14 tablet Dahlia Byes A, NP     Controlled Substance Prescriptions [place] Controlled Substance Registry consulted? Not Applicable   Janace Aris, NP 08/12/18 1524

## 2018-08-13 LAB — CERVICOVAGINAL ANCILLARY ONLY
Bacterial vaginitis: POSITIVE — AB
Candida vaginitis: NEGATIVE
Chlamydia: NEGATIVE
NEISSERIA GONORRHEA: NEGATIVE
TRICH (WINDOWPATH): NEGATIVE

## 2018-09-17 ENCOUNTER — Encounter (HOSPITAL_COMMUNITY): Payer: Self-pay | Admitting: Emergency Medicine

## 2018-09-17 ENCOUNTER — Ambulatory Visit (HOSPITAL_COMMUNITY)
Admission: EM | Admit: 2018-09-17 | Discharge: 2018-09-17 | Disposition: A | Discharge status: Home / Self Care | Payer: Medicaid Other | Attending: Family Medicine | Admitting: Family Medicine

## 2018-09-17 DIAGNOSIS — Z91030 Bee allergy status: Secondary | ICD-10-CM | POA: Diagnosis not present

## 2018-09-17 DIAGNOSIS — Z791 Long term (current) use of non-steroidal anti-inflammatories (NSAID): Secondary | ICD-10-CM | POA: Diagnosis not present

## 2018-09-17 DIAGNOSIS — Z113 Encounter for screening for infections with a predominantly sexual mode of transmission: Secondary | ICD-10-CM | POA: Diagnosis not present

## 2018-09-17 DIAGNOSIS — N898 Other specified noninflammatory disorders of vagina: Secondary | ICD-10-CM | POA: Diagnosis present

## 2018-09-17 LAB — POCT PREGNANCY, URINE: PREG TEST UR: NEGATIVE

## 2018-09-17 MED ORDER — METRONIDAZOLE 500 MG PO TABS: 500.0000 mg | ORAL_TABLET | Freq: Two times a day (BID) | ORAL | 0 refills | Status: DC

## 2018-09-17 MED ORDER — FLUCONAZOLE 200 MG PO TABS: ORAL_TABLET | ORAL | 0 refills | Status: DC

## 2018-09-17 NOTE — ED Provider Notes (Signed)
[facility]   [id number] 09/17/18 Arrival Time: 1042   [id number] DISCHARGE  SUBJECTIVE:  Marie Mathews is a 22 y.o. female who presents with complaints of vaginal discharge and odor that began a couple of days ago.  Last unprotected sexual encounter a couple of days ago.  Sexually active with 1 female partner.  Describes discharge as thick and "pasty."  Denies aggravating or alleviating factors.  She reports similar symptoms in the past and was diagnosed with BV and yeast.  She denies fever, chills, nausea, vomiting, abdominal or pelvic pain, urinary symptoms, vaginal itching, vaginal odor, vaginal bleeding, dyspareunia, vaginal rashes or lesions.   No LMP recorded. Patient has had an implant.  ROS: As per HPI.  History reviewed. No pertinent past medical history. History reviewed. No pertinent surgical history. Allergies  Allergen Reactions  . Bee Venom Anaphylaxis   No current facility-administered medications on file prior to encounter.    Current Outpatient Medications on File Prior to Encounter  Medication Sig Dispense Refill  . diphenhydrAMINE (BENADRYL) 25 MG tablet Take 1 tablet (25 mg total) by mouth every 6 (six) hours. Take 1 tablet by mouth every 6 hours x 24 hours then may take as needed 20 tablet 0  . ibuprofen (ADVIL,MOTRIN) 600 MG tablet Take 1 tablet (600 mg total) by mouth every 6 (six) hours. 30 tablet 0    Social History   Socioeconomic History  . Marital status: Single    Spouse name: Not on file  . Number of children: Not on file  . Years of education: Not on file  . Highest education level: Not on file  Occupational History  . Not on file  Social Needs  . Financial resource strain: Not on file  . Food insecurity:    Worry: Not on file    Inability: Not on file  . Transportation needs:    Medical: Not on file    Non-medical: Not on file  Tobacco Use  . Smoking status: Never Smoker  . Smokeless tobacco: Never Used  Substance and  Sexual Activity  . Alcohol use: No  . Drug use: No  . Sexual activity: Yes    Birth control/protection: None  Lifestyle  . Physical activity:    Days per week: Not on file    Minutes per session: Not on file  . Stress: Not on file  Relationships  . Social connections:    Talks on phone: Not on file    Gets together: Not on file    Attends religious service: Not on file    Active member of club or organization: Not on file    Attends meetings of clubs or organizations: Not on file    Relationship status: Not on file  . Intimate partner violence:    Fear of current or ex partner: Not on file    Emotionally abused: Not on file    Physically abused: Not on file    Forced sexual activity: Not on file  Other Topics Concern  . Not on file  Social History Narrative  . Not on file   History reviewed. No pertinent family history.  OBJECTIVE:  Vitals:   09/17/18 1134  BP: 134/82  Pulse: 84  Resp: 18  Temp: 98.6 F (37 C)  TempSrc: Oral  SpO2: 100%     General appearance: alert, cooperative, appears stated age and no distress Throat: lips, mucosa, and tongue normal; teeth and gums normal Lungs: CTA bilaterally without adventitious breath sounds  Heart: regular rate and rhythm.  Radial pulses 2+ symmetrical bilaterally Back: no CVA tenderness Abdomen: soft, non-tender; bowel sounds normal; no masses or organomegaly; no guarding or rebound tenderness GU: Vaginal swab obtained Skin: warm and dry Psychological:  Alert and cooperative. Normal mood and affect.  Results for orders placed or performed during the hospital encounter of 09/17/18  Pregnancy, urine POC  Result Value Ref Range   Preg Test, Ur NEGATIVE NEGATIVE    ASSESSMENT & PLAN:  1. Vaginal discharge   2. Screen for STD (sexually transmitted disease)     Meds ordered this encounter  Medications  . metroNIDAZOLE (FLAGYL) 500 MG tablet    Sig: Take 1 tablet (500 mg total) by mouth 2 (two) times daily.     Dispense:  14 tablet    Refill:  0    Order Specific Question:   Supervising Provider    Answer:   Isa Rankin [phone]  . fluconazole (DIFLUCAN) 200 MG tablet    Sig: Take one dose by mouth, wait 72 hours, and then take second dose by mouth    Dispense:  2 tablet    Refill:  0    Order Specific Question:   Supervising Provider    Answer:   Isa Rankin [[id number]]    Pending: Labs Reviewed  RPR  HIV ANTIBODY (ROUTINE TESTING W REFLEX)  POCT PREGNANCY, URINE  CERVICOVAGINAL ANCILLARY ONLY    Declines rocephin 250mg  injection and azithromycin 1g in office Vaginal swab obtained HIV/ syphilis testing today Prescribed metronidazole 500 mg twice daily for 7 days (do not take while consuming alcohol) Prescribed diflucan 200 mg once daily and then second dose 72 hours later Take medications as prescribed and to completion We will follow up with you regarding the results of your test If tests are positive, please abstain from sexual activity for at least 7 days and notify partners Follow up with PCP if symptoms persists Return here or go to ER if you have any new or worsening symptoms abdominal pain, changes in urinary habits, vaginal bleeding, pelvic pain, fever, nausea, vomiting, etc...  Reviewed expectations re: course of current medical issues. Questions answered. Outlined signs and symptoms indicating need for more acute intervention. Patient verbalized understanding. After Visit Summary given.       Rennis Harding, PA-C 09/17/18 1221

## 2018-09-17 NOTE — Discharge Instructions (Signed)
Declines rocephin 250mg  injection and azithromycin 1g in office Vaginal swab obtained HIV/ syphilis testing today Prescribed metronidazole 500 mg twice daily for 7 days (do not take while consuming alcohol) Prescribed diflucan 200 mg once daily and then second dose 72 hours later Take medications as prescribed and to completion We will follow up with you regarding the results of your test If tests are positive, please abstain from sexual activity for at least 7 days and notify partners Follow up with PCP if symptoms persists Return here or go to ER if you have any new or worsening symptoms abdominal pain, changes in urinary habits, vaginal bleeding, pelvic pain, fever, nausea, vomiting, etc..Marland Kitchen

## 2018-09-17 NOTE — ED Triage Notes (Signed)
Pt here for STD testing; pt sts vaginal discharge

## 2018-09-18 LAB — RPR: RPR Ser Ql: NONREACTIVE

## 2018-09-18 LAB — CERVICOVAGINAL ANCILLARY ONLY
BACTERIAL VAGINITIS: POSITIVE — AB
Candida vaginitis: POSITIVE — AB
Chlamydia: NEGATIVE
NEISSERIA GONORRHEA: NEGATIVE
TRICH (WINDOWPATH): NEGATIVE

## 2018-09-18 LAB — HIV ANTIBODY (ROUTINE TESTING W REFLEX): HIV Screen 4th Generation wRfx: NONREACTIVE

## 2019-07-07 IMAGING — DX DG KNEE COMPLETE 4+V*L*
4 series · 4 of 4 positions shown · non-contrast
Comparison: None.

CLINICAL DATA: Pain and swelling left knee 1 week.  No injury.

EXAM:
LEFT KNEE - COMPLETE 4+ VIEW

[knee ap]
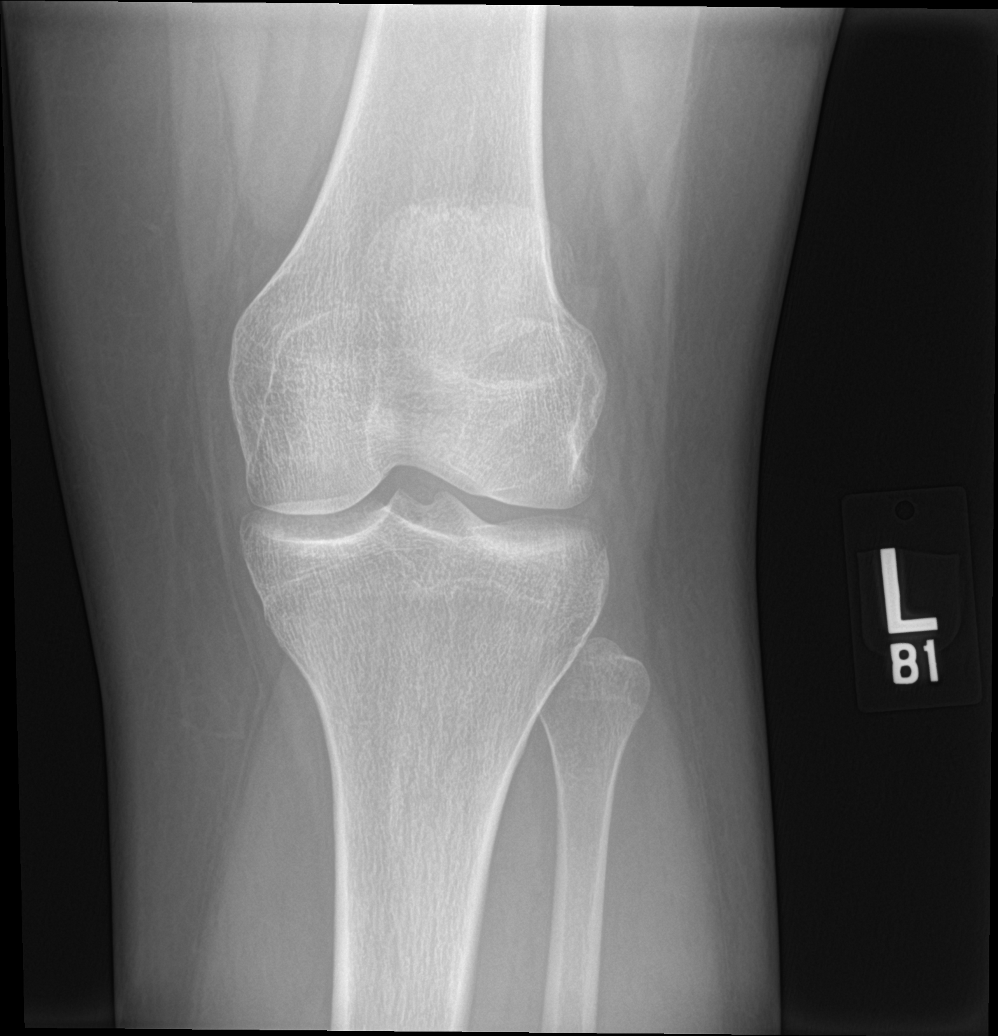

[knee obl (1 of 2)]
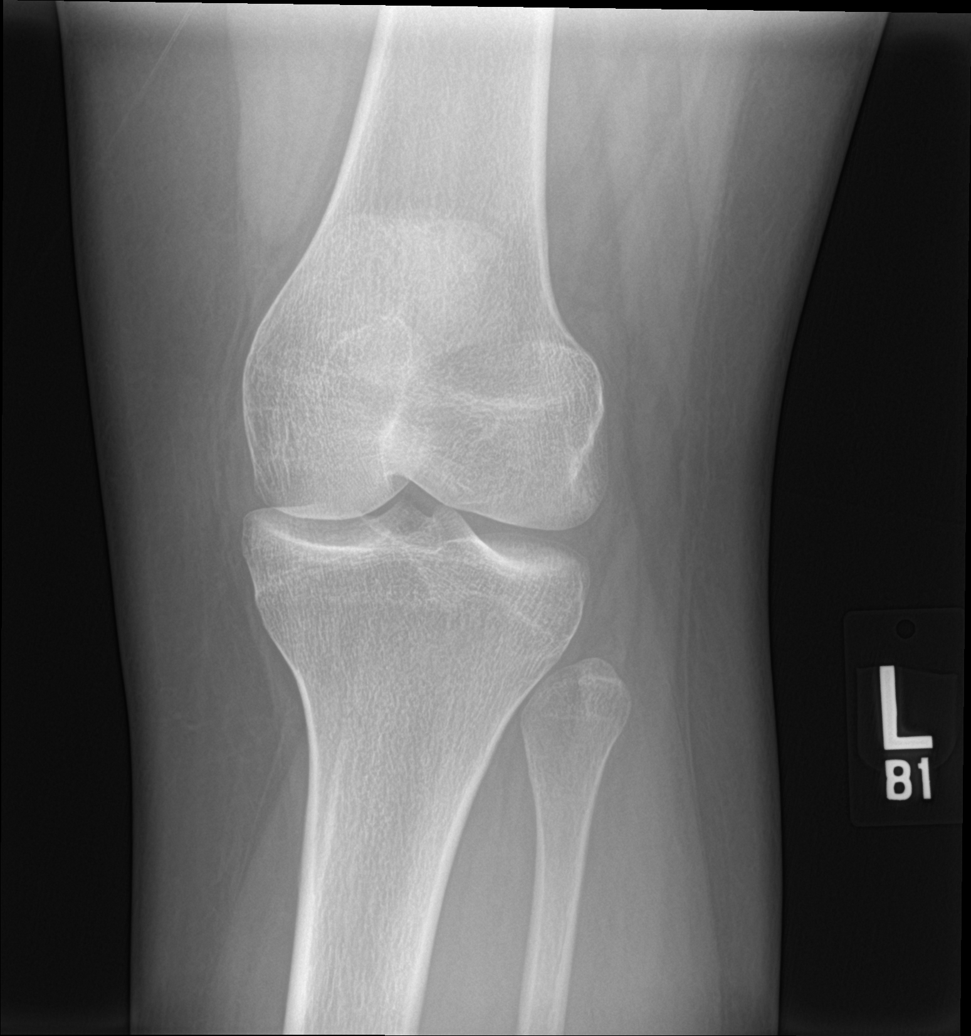

[knee obl (2 of 2)]
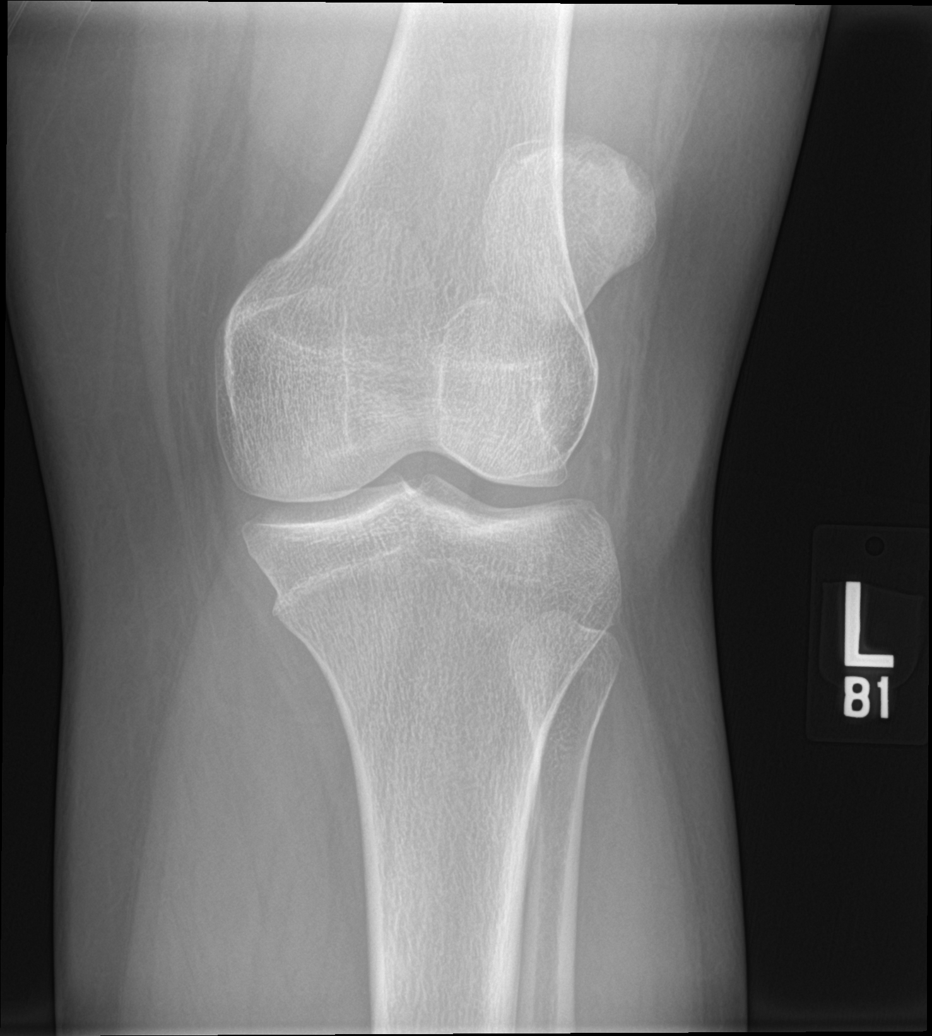

[knee lat]
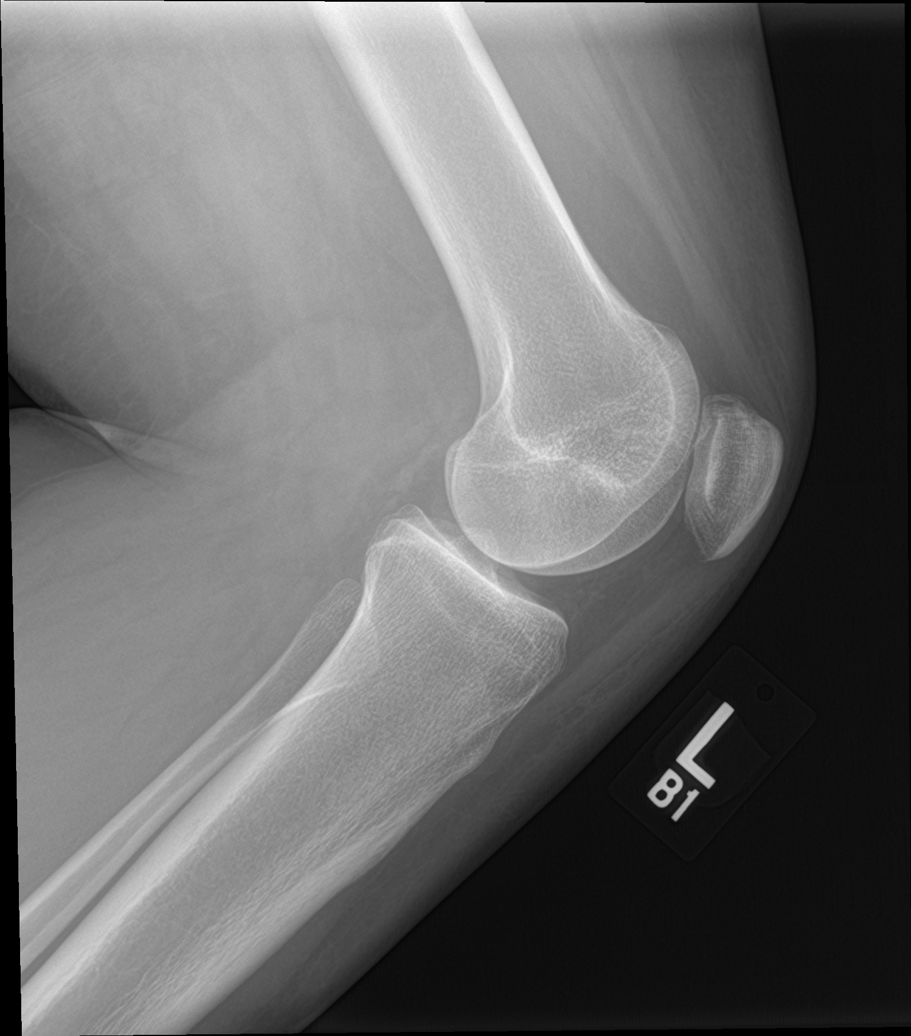

[4 of 4 positions shown; findings below may reference images not displayed]

FINDINGS: No evidence of fracture, dislocation, or joint effusion. No evidence
of arthropathy or other focal bone abnormality. Soft tissues are
unremarkable.
IMPRESSION: Negative.

## 2019-08-12 ENCOUNTER — Other Ambulatory Visit: Payer: Self-pay

## 2019-08-12 ENCOUNTER — Ambulatory Visit (HOSPITAL_COMMUNITY)
Admission: EM | Admit: 2019-08-12 | Discharge: 2019-08-12 | Disposition: A | Discharge status: Home / Self Care | Payer: Medicaid Other | Attending: Family Medicine | Admitting: Family Medicine

## 2019-08-12 ENCOUNTER — Encounter (HOSPITAL_COMMUNITY): Payer: Self-pay

## 2019-08-12 DIAGNOSIS — G43809 Other migraine, not intractable, without status migrainosus: Secondary | ICD-10-CM | POA: Diagnosis present

## 2019-08-12 DIAGNOSIS — N758 Other diseases of Bartholin's gland: Secondary | ICD-10-CM | POA: Insufficient documentation

## 2019-08-12 HISTORY — DX: Other cervical disc displacement, unspecified cervical region: M50.20

## 2019-08-12 MED ORDER — SULFAMETHOXAZOLE-TRIMETHOPRIM 800-160 MG PO TABS: 1.0000 | ORAL_TABLET | Freq: Two times a day (BID) | ORAL | 0 refills | Status: AC

## 2019-08-12 NOTE — ED Triage Notes (Signed)
Pt presents with vaginal pain, vaginal odor, and discharge X 1 week.  Pt also presents with ongoing headache tha has been unrelieved with OTC medication.

## 2019-08-12 NOTE — Discharge Instructions (Addendum)
You may use over the counter ibuprofen or acetaminophen as needed.   We have sent testing for sexually transmitted infections. We will notify you of any positive results once they are received. If required, we will prescribe any medications you might need.  Please refrain from all sexual activity for at least the next seven days.

## 2019-08-13 LAB — CERVICOVAGINAL ANCILLARY ONLY
Bacterial Vaginitis (gardnerella): POSITIVE — AB
Candida Glabrata: NEGATIVE
Candida Vaginitis: POSITIVE — AB
Chlamydia: POSITIVE — AB
Molecular Disclaimer: NEGATIVE
Molecular Disclaimer: NEGATIVE
Molecular Disclaimer: NEGATIVE
Molecular Disclaimer: NEGATIVE
Molecular Disclaimer: NORMAL
Molecular Disclaimer: NORMAL
Neisseria Gonorrhea: NEGATIVE
Trichomonas: POSITIVE — AB

## 2019-08-13 NOTE — ED Provider Notes (Signed)
[facility]   [id number] 08/12/19 Arrival Time: [place]:  1. Bartholin's gland infection   2. Other migraine without status migrainosus, not intractable     Migraine headache has resolved. No evidence of abscess requiring I&D. To begin: Meds ordered this encounter  Medications  . sulfamethoxazole-trimethoprim (BACTRIM DS) 800-160 MG tablet    Sig: Take 1 tablet by mouth 2 (two) times daily for 7 days.    Dispense:  14 tablet    Refill:  0   Follow-up Information    Chancy Milroy, MD.   Specialty: Obstetrics and Gynecology Why: As needed. Contact information: [place] [address] [address].   Specialty: Urgent Care Why: If worsening or failing to improve as anticipated. Contact information: [place] [phone]            Discharge Instructions     You may use over the counter ibuprofen or acetaminophen as needed.   We have sent testing for sexually transmitted infections. We will notify you of any positive results once they are received. If required, we will prescribe any medications you might need.  Please refrain from all sexual activity for at least the next seven days.     Pending: Labs Reviewed  CERVICOVAGINAL ANCILLARY ONLY    Will notify of any positive results. Instructed to refrain from sexual activity for at least seven days.  Reviewed expectations re: course of current medical issues. Questions answered. Outlined signs and symptoms indicating need for more acute intervention. Patient verbalized understanding. After Visit Summary given.   SUBJECTIVE:  Marie Mathews is a 23 y.o. female who presents with complaint of "an area that is sore in my vagina". Onset gradual. First noticed several days ago. Without vaginal discharge. Denies: urinary frequency, hematuria, urinary hesitancy,  chills and sweats. Afebrile. No abdominal or pelvic pain. Normal PO intake wihout n/v. No rashes or lesions. Reports that she is sexually active with single female partner. OTC treatment: none. History of STI: none reported..  No LMP recorded. Patient has had an implant.  Also reports a migraine headache for the past 2-3 days. Has now resolved. Frontal; throbbing. Mild photophobia. No n/v. Ambulatory without difficulty.  ROS: As per HPI. All other systems negative.   OBJECTIVE:  Vitals:   08/12/19 1500  BP: 121/82  Pulse: 76  Resp: 18  Temp: 98.1 F (36.7 C)  TempSrc: Oral  SpO2: 99%     General appearance: alert, cooperative, appears stated age and no distress Throat: lips, mucosa, and tongue normal; teeth and gums normal CV: RRR Lungs: CTAB Back: no CVA tenderness; FROM at waist Abdomen: soft, non-tender GU: very small area of erythema around R Bartholin gland; no fluctuance; no active drainage; tender to touch Skin: warm and dry Psychological: alert and cooperative; normal mood and affect.    Labs Reviewed  CERVICOVAGINAL ANCILLARY ONLY    Allergies  Allergen Reactions  . Bee Venom Anaphylaxis    Past Medical History:  Diagnosis Date  . Herniated disc, cervical     Social History   Socioeconomic History  . Marital status: Single    Spouse name: Not on file  . Number of children: Not on file  . Years of education: Not on file  . Highest education level: Not on file  Occupational History  . Not on file  Social Needs  .  Financial resource strain: Not on file  . Food insecurity    Worry: Not on file    Inability: Not on file  . Transportation needs    Medical: Not on file    Non-medical: Not on file  Tobacco Use  . Smoking status: Never Smoker  . Smokeless tobacco: Never Used  Substance and Sexual Activity  . Alcohol use: No  . Drug use: No  . Sexual activity: Yes    Birth control/protection: None  Lifestyle  . Physical activity    Days per  week: Not on file    Minutes per session: Not on file  . Stress: Not on file  Relationships  . Social Musician on phone: Not on file    Gets together: Not on file    Attends religious service: Not on file    Active member of club or organization: Not on file    Attends meetings of clubs or organizations: Not on file    Relationship status: Not on file  . Intimate partner violence    Fear of current or ex partner: Not on file    Emotionally abused: Not on file    Physically abused: Not on file    Forced sexual activity: Not on file  Other Topics Concern  . Not on file  Social History Narrative  . Not on file          Mardella Layman, MD 08/13/19 [phone]

## 2019-08-14 ENCOUNTER — Telehealth (HOSPITAL_COMMUNITY): Payer: Self-pay | Admitting: Emergency Medicine

## 2019-08-14 MED ORDER — FLUCONAZOLE 150 MG PO TABS: ORAL_TABLET | ORAL | 0 refills | Status: DC

## 2019-08-14 MED ORDER — AZITHROMYCIN 250 MG PO TABS: 1000.0000 mg | ORAL_TABLET | Freq: Once | ORAL | 0 refills | Status: AC

## 2019-08-14 MED ORDER — METRONIDAZOLE 500 MG PO TABS: 500.0000 mg | ORAL_TABLET | Freq: Two times a day (BID) | ORAL | 0 refills | Status: AC

## 2019-08-14 NOTE — Telephone Encounter (Signed)
Chlamydia is positive.  Rx po zithromax 1g #1 dose no refills was sent to the pharmacy of record.  Pt needs education to please refrain from sexual intercourse for 7 days to give the medicine time to work, sexual partners need to be notified and tested/treated.  Condoms may reduce risk of reinfection.  Recheck or followup with PCP for further evaluation if symptoms are not improving.   [facility] notified.  Trichomonas is positive. Rx  for Flagyl 2 grams, once was sent to the pharmacy of record. Pt needs education to refrain from sexual intercourse for 7 days to give the medicine time to work. Sexual partners need to be notified and tested/treated. Condoms may reduce risk of reinfection. Recheck for further evaluation if symptoms are not improving.   Bacterial vaginosis is positive. This was not treated at the urgent care visit.  Flagyl 500 mg BID x 7 days #14 no refills sent to patients pharmacy of choice.    Test for candida (yeast) was positive.  Prescription for fluconazole 150mg  po now, repeat dose in 3d if needed, #2 no refills, sent to the pharmacy of record.  Recheck or followup with PCP for further evaluation if symptoms are not improving.    Patient contacted and made aware of    results, all questions answered

## 2020-10-20 ENCOUNTER — Other Ambulatory Visit: Payer: Self-pay

## 2020-10-20 ENCOUNTER — Emergency Department (HOSPITAL_COMMUNITY)
Admission: EM | Admit: 2020-10-20 | Discharge: 2020-10-21 | Disposition: A | Discharge status: Home / Self Care | Payer: Medicaid Other | Attending: Emergency Medicine | Admitting: Emergency Medicine

## 2020-10-20 ENCOUNTER — Encounter (HOSPITAL_COMMUNITY): Payer: Self-pay

## 2020-10-20 DIAGNOSIS — Z20822 Contact with and (suspected) exposure to covid-19: Secondary | ICD-10-CM | POA: Diagnosis not present

## 2020-10-20 DIAGNOSIS — B349 Viral infection, unspecified: Secondary | ICD-10-CM | POA: Insufficient documentation

## 2020-10-20 DIAGNOSIS — E876 Hypokalemia: Secondary | ICD-10-CM | POA: Diagnosis not present

## 2020-10-20 DIAGNOSIS — M545 Low back pain, unspecified: Secondary | ICD-10-CM | POA: Diagnosis not present

## 2020-10-20 DIAGNOSIS — G8929 Other chronic pain: Secondary | ICD-10-CM | POA: Insufficient documentation

## 2020-10-20 DIAGNOSIS — R112 Nausea with vomiting, unspecified: Secondary | ICD-10-CM

## 2020-10-20 DIAGNOSIS — R509 Fever, unspecified: Secondary | ICD-10-CM | POA: Diagnosis present

## 2020-10-20 MED ORDER — KETOROLAC TROMETHAMINE 30 MG/ML IJ SOLN
30.0000 mg | Freq: Once | INTRAMUSCULAR | Status: AC
  Administered 2020-10-21: 30 mg via INTRAVENOUS
  Filled 2020-10-20: qty 1

## 2020-10-20 MED ORDER — SODIUM CHLORIDE 0.9 % IV BOLUS (SEPSIS)
1000.0000 mL | Freq: Once | INTRAVENOUS | Status: AC
  Administered 2020-10-21: 1000 mL via INTRAVENOUS

## 2020-10-20 MED ORDER — ONDANSETRON HCL 4 MG/2ML IJ SOLN
4.0000 mg | Freq: Once | INTRAMUSCULAR | Status: AC
  Administered 2020-10-21: 4 mg via INTRAVENOUS
  Filled 2020-10-20: qty 2

## 2020-10-20 MED ORDER — ACETAMINOPHEN 500 MG PO TABS
1000.0000 mg | ORAL_TABLET | Freq: Once | ORAL | Status: AC
  Administered 2020-10-21: 1000 mg via ORAL
  Filled 2020-10-20: qty 2

## 2020-10-20 NOTE — ED Triage Notes (Addendum)
Pt reports vomiting and headache x 5 days, chills, body aches, and cough. Denies covid vaccinations.

## 2020-10-20 NOTE — ED Provider Notes (Signed)
TIME SEEN: 11:36 PM  CHIEF COMPLAINT: Fever, cough, runny nose, nausea, vomiting, body aches  HPI: Patient is a 24 year old female who presents to the emergency department with 5 days of fever, dry cough, runny nose and nausea and vomiting that started today.  Having body aches which is exacerbating her chronic low back pain where she has a herniated disc.  No numbness, tingling or weakness.  Able to ambulate.  States she is not able to keep any over-the-counter medications done at home.  She is not vaccinated for COVID-19 or influenza.  Currently on her menstrual cycle.  ROS: See HPI Constitutional: no fever  Eyes: no drainage  ENT: no runny nose   Cardiovascular:  no chest pain  Resp: no SOB  GI:  vomiting GU: no dysuria Integumentary: no rash  Allergy: no hives  Musculoskeletal: no leg swelling  Neurological: no slurred speech ROS otherwise negative  PAST MEDICAL HISTORY/PAST SURGICAL HISTORY:  Past Medical History:  Diagnosis Date  . Herniated disc, cervical     MEDICATIONS:  Prior to Admission medications   Medication Sig Start Date End Date Taking? Authorizing Provider  diphenhydrAMINE (BENADRYL) 25 MG tablet Take 1 tablet (25 mg total) by mouth every 6 (six) hours. Take 1 tablet by mouth every 6 hours x 24 hours then may take as needed 07/01/18   Lurline Idol, FNP  fluconazole (DIFLUCAN) 150 MG tablet Take 1 dose on day 1, take 2nd dose on day 7 08/14/19   Eustace Moore, MD  ibuprofen (ADVIL,MOTRIN) 600 MG tablet Take 1 tablet (600 mg total) by mouth every 6 (six) hours. 12/24/17   Donette Larry, CNM    ALLERGIES:  Allergies  Allergen Reactions  . Bee Venom Anaphylaxis    SOCIAL HISTORY:  Social History   Tobacco Use  . Smoking status: Never Smoker  . Smokeless tobacco: Never Used  Substance Use Topics  . Alcohol use: No    FAMILY HISTORY: No family history on file.  EXAM: BP 118/90   Pulse (!) 116   Temp 100 F (37.8 C) (Oral)   Resp 18    Ht 5\' 5"  (1.651 m)   Wt 59 kg   SpO2 98%   BMI 21.63 kg/m  CONSTITUTIONAL: Alert and oriented and responds appropriately to questions. Well-appearing; well-nourished, nontoxic-appearing, tearful HEAD: Normocephalic EYES: Conjunctivae clear, pupils appear equal, EOM appear intact ENT: normal nose; moist mucous membranes; No pharyngeal erythema or petechiae, no tonsillar hypertrophy or exudate, no uvular deviation, no unilateral swelling, no trismus or drooling, no muffled voice, normal phonation, no stridor, no dental caries present, no drainable dental abscess noted, no Ludwig's angina, tongue sits flat in the bottom of the mouth, no angioedema, no facial erythema or warmth, no facial swelling; no pain with movement of the neck, no cervical LAD.  TMs are clear bilaterally without erythema, purulence, bulging, perforation, effusion.  No cerumen impaction or sign of foreign body in the external auditory canal. No inflammation, erythema or drainage from the external auditory canal. No signs of mastoiditis. No pain with manipulation of the pinna bilaterally.   NECK: Supple, normal ROM, no meningismus, no cervical lymphadenopathy CARD: Regular and tachycardic; S1 and S2 appreciated; no murmurs, no clicks, no rubs, no gallops RESP: Normal chest excursion without splinting or tachypnea; breath sounds clear and equal bilaterally; no wheezes, no rhonchi, no rales, no hypoxia or respiratory distress, speaking full sentences ABD/GI: Normal bowel sounds; non-distended; soft, non-tender, no rebound, no guarding, no peritoneal signs, no hepatosplenomegaly  BACK:  The back appears normal EXT: Normal ROM in all joints; no deformity noted, no edema; no cyanosis SKIN: Normal color for age and race; warm; no rash on exposed skin NEURO: Moves all extremities equally; normal sensation diffusely, normal gait PSYCH: The patient's mood and manner are appropriate.   MEDICAL DECISION MAKING: Patient here with symptoms of  viral illness, possibly COVID 19.  Does not meet criteria for MAB.  Will test for COVID and treat symptoms.  Will give IVF and antiemetics, antipyretics.  Abdominal exam benign.  No SOB, hypoxia, increased WOB, resp distress.  She is well appearing, non toxic today.    ED PROGRESS: Patient's vital signs appear to be improving.  Potassium level is low at 2.8.  Will give oral and IV replacement.  She also has minimally elevated creatinine.  Will give second liter of fluids.  Magnesium level today normal.  She has a leukocytosis consistent with infection.  Doubt appendicitis, cholecystitis, colitis based on her benign exam.  Will obtain EKG.  She reports feeling better.  Will p.o. challenge.  Urine pending.  Covid and flu negative.  Patient able to tolerate p.o.  Urine appears contaminated.  She is not having any urinary or GU symptoms.  I do not feel she needs to be treated for UTI at this time.  Discharge once IV potassium complete.  Will discharge with Zofran and oral potassium.  Repeat abdominal exam is still benign.  At this time, I do not feel there is any life-threatening condition present. I have reviewed, interpreted and discussed all results (EKG, imaging, lab, urine as appropriate) and exam findings with patient/family. I have reviewed nursing notes and appropriate previous records.  I feel the patient is safe to be discharged home without further emergent workup and can continue workup as an outpatient as needed. Discussed usual and customary return precautions. Patient/family verbalize understanding and are comfortable with this plan.  Outpatient follow-up has been provided as needed. All questions have been answered.     Date: 10/21/2020 2:19 AM   Rate: 77  Rhythm: normal sinus rhythm  QRS Axis: normal  Intervals: normal (QTc interval 430 ms)  ST/T Wave abnormalities: normal  Conduction Disutrbances: none  Narrative Interpretation: unremarkable     Marie Mathews was evaluated in  Emergency Department on 10/20/2020 for the symptoms described in the history of present illness. She was evaluated in the context of the global COVID-19 pandemic, which necessitated consideration that the patient might be at risk for infection with the SARS-CoV-2 virus that causes COVID-19. Institutional protocols and algorithms that pertain to the evaluation of patients at risk for COVID-19 are in a state of rapid change based on information released by regulatory bodies including the CDC and federal and state organizations. These policies and algorithms were followed during the patient's care in the ED.      Cloyce Blankenhorn, Layla Maw, DO 10/21/20 [phone]

## 2020-10-21 LAB — CBC WITH DIFFERENTIAL/PLATELET
Abs Immature Granulocytes: 0.16 10*3/uL — ABNORMAL HIGH (ref 0.00–0.07)
Basophils Absolute: 0 10*3/uL (ref 0.0–0.1)
Basophils Relative: 0 %
Eosinophils Absolute: 0 10*3/uL (ref 0.0–0.5)
Eosinophils Relative: 0 %
HCT: 42.6 % (ref 36.0–46.0)
Hemoglobin: 14.8 g/dL (ref 12.0–15.0)
Immature Granulocytes: 1 %
Lymphocytes Relative: 8 %
Lymphs Abs: 1.3 10*3/uL (ref 0.7–4.0)
MCH: 28.7 pg (ref 26.0–34.0)
MCHC: 34.7 g/dL (ref 30.0–36.0)
MCV: 82.6 fL (ref 80.0–100.0)
Monocytes Absolute: 1.5 10*3/uL — ABNORMAL HIGH (ref 0.1–1.0)
Monocytes Relative: 9 %
Neutro Abs: 13.8 10*3/uL — ABNORMAL HIGH (ref 1.7–7.7)
Neutrophils Relative %: 82 %
Platelets: 230 10*3/uL (ref 150–400)
RBC: 5.16 MIL/uL — ABNORMAL HIGH (ref 3.87–5.11)
RDW: 13.2 % (ref 11.5–15.5)
WBC: 16.9 10*3/uL — ABNORMAL HIGH (ref 4.0–10.5)
nRBC: 0 % (ref 0.0–0.2)

## 2020-10-21 LAB — COMPREHENSIVE METABOLIC PANEL
ALT: 12 U/L (ref 0–44)
AST: 14 U/L — ABNORMAL LOW (ref 15–41)
Albumin: 3.9 g/dL (ref 3.5–5.0)
Alkaline Phosphatase: 79 U/L (ref 38–126)
Anion gap: 14 (ref 5–15)
BUN: 8 mg/dL (ref 6–20)
CO2: 25 mmol/L (ref 22–32)
Calcium: 8.6 mg/dL — ABNORMAL LOW (ref 8.9–10.3)
Chloride: 95 mmol/L — ABNORMAL LOW (ref 98–111)
Creatinine, Ser: 1.13 mg/dL — ABNORMAL HIGH (ref 0.44–1.00)
GFR, Estimated: >60 mL/min (ref >60)
Glucose, Bld: 152 mg/dL — ABNORMAL HIGH (ref 70–99)
Potassium: 2.8 mmol/L — ABNORMAL LOW (ref 3.5–5.1)
Sodium: 134 mmol/L — ABNORMAL LOW (ref 135–145)
Total Bilirubin: 1.1 mg/dL (ref 0.3–1.2)
Total Protein: 8.5 g/dL — ABNORMAL HIGH (ref 6.5–8.1)

## 2020-10-21 LAB — RESP PANEL BY RT-PCR (FLU A&B, COVID) ARPGX2
Influenza A by PCR: NEGATIVE
Influenza B by PCR: NEGATIVE
SARS Coronavirus 2 by RT PCR: NEGATIVE

## 2020-10-21 LAB — URINALYSIS, ROUTINE W REFLEX MICROSCOPIC
Bilirubin Urine: NEGATIVE
Glucose, UA: NEGATIVE mg/dL
Ketones, ur: NEGATIVE mg/dL
Nitrite: NEGATIVE
Protein, ur: 100 mg/dL — AB
RBC / HPF: >50 RBC/hpf — ABNORMAL HIGH (ref 0–5)
Specific Gravity, Urine: 1.011 (ref 1.005–1.030)
WBC, UA: >50 WBC/hpf — ABNORMAL HIGH (ref 0–5)
pH: 6 (ref 5.0–8.0)

## 2020-10-21 LAB — MAGNESIUM: Magnesium: 2.2 mg/dL (ref 1.7–2.4)

## 2020-10-21 LAB — LIPASE, BLOOD: Lipase: 15 U/L (ref 11–51)

## 2020-10-21 LAB — I-STAT BETA HCG BLOOD, ED (MC, WL, AP ONLY): I-stat hCG, quantitative: 9.3 m[IU]/mL — ABNORMAL HIGH (ref <5)

## 2020-10-21 LAB — HCG, QUANTITATIVE, PREGNANCY: hCG, Beta Chain, Quant, S: <1 m[IU]/mL (ref <5)

## 2020-10-21 MED ORDER — POTASSIUM CHLORIDE CRYS ER 20 MEQ PO TBCR: 40.0000 meq | EXTENDED_RELEASE_TABLET | Freq: Every day | ORAL | 0 refills | Status: DC

## 2020-10-21 MED ORDER — ONDANSETRON 4 MG PO TBDP: 4.0000 mg | ORAL_TABLET | Freq: Four times a day (QID) | ORAL | 0 refills | Status: DC | PRN

## 2020-10-21 MED ORDER — SODIUM CHLORIDE 0.9 % IV BOLUS (SEPSIS)
1000.0000 mL | Freq: Once | INTRAVENOUS | Status: AC
  Administered 2020-10-21: 1000 mL via INTRAVENOUS

## 2020-10-21 MED ORDER — POTASSIUM CHLORIDE 20 MEQ PO PACK
40.0000 meq | PACK | Freq: Every day | ORAL | Status: DC
  Filled 2020-10-21: qty 2

## 2020-10-21 MED ORDER — POTASSIUM CHLORIDE 20 MEQ PO PACK
40.0000 meq | PACK | Freq: Once | ORAL | Status: AC
  Administered 2020-10-21: 40 meq via ORAL

## 2020-10-21 MED ORDER — POTASSIUM CHLORIDE 10 MEQ/100ML IV SOLN
10.0000 meq | INTRAVENOUS | Status: AC
  Administered 2020-10-21 (×2): 10 meq via INTRAVENOUS
  Filled 2020-10-21: qty 100

## 2020-10-21 NOTE — Discharge Instructions (Signed)
You may alternate Tylenol 1000 mg every 6 hours as needed for pain, fever and Ibuprofen 800 mg every 8 hours as needed for pain, fever.  Please take Ibuprofen with food.  Do not take more than 4000 mg of Tylenol (acetaminophen) in a 24 hour period.  Steps to find a Primary Care Provider (PCP):  Call [phone] or [phone] to access "[facility] Find a Doctor Service."  2.  You may also go on the [facility] website at [email or web address]  3.  [facility] also frequently accepts new patients.  [facility]  [address] [place] [address] [phone]  4.  There are also multiple [facility], [facility] [place] [facility] and [facility] practices throughout the [place] that are frequently accepting new patients. You may find a clinic that is close to your home and contact them.  [email or web address] [phone]  [facility] [email or web address]  [email or web address] [phone]  [email or web address] [phone]  5.  Local Health Departments also can provide primary care services.  [facility]  [address] [place], [place] [address] [phone]  [facility] [address] [place] [address] [phone]  [facility] [address]  [place] [address] [phone]   

## 2021-06-19 ENCOUNTER — Encounter: Payer: Medicaid Other | Admitting: Student

## 2022-02-01 ENCOUNTER — Telehealth: Payer: Self-pay

## 2022-02-01 NOTE — Telephone Encounter (Signed)
Pt scheduled appt through [facility] portal. Unable to reach at phone # in chart x 2 calls. Sent email to pt email listed that we are not accepting new Medicaid patients with Rodman Pickle, DNP at this time. ?

## 2022-02-20 ENCOUNTER — Ambulatory Visit: Payer: Medicaid Other | Admitting: Nurse Practitioner

## 2023-06-05 DIAGNOSIS — Z7251 High risk heterosexual behavior: Secondary | ICD-10-CM | POA: Diagnosis not present

## 2023-08-27 ENCOUNTER — Encounter (HOSPITAL_COMMUNITY): Payer: Self-pay

## 2023-08-27 ENCOUNTER — Emergency Department (HOSPITAL_COMMUNITY)
Admission: EM | Admit: 2023-08-27 | Discharge: 2023-08-28 | Disposition: A | Discharge status: Home / Self Care | Payer: 59 | Attending: Emergency Medicine | Admitting: Emergency Medicine

## 2023-08-27 ENCOUNTER — Other Ambulatory Visit: Payer: Self-pay

## 2023-08-27 ENCOUNTER — Ambulatory Visit (HOSPITAL_COMMUNITY)
Admission: RE | Admit: 2023-08-27 | Discharge: 2023-08-27 | Disposition: A | Discharge status: Home / Self Care | Payer: 59 | Source: Ambulatory Visit | Attending: Family Medicine | Admitting: Family Medicine

## 2023-08-27 VITALS — BP 129/83 | HR 80 | Temp 97.4°F | Resp 20

## 2023-08-27 DIAGNOSIS — R1084 Generalized abdominal pain: Secondary | ICD-10-CM

## 2023-08-27 DIAGNOSIS — R112 Nausea with vomiting, unspecified: Secondary | ICD-10-CM | POA: Insufficient documentation

## 2023-08-27 DIAGNOSIS — R61 Generalized hyperhidrosis: Secondary | ICD-10-CM | POA: Diagnosis not present

## 2023-08-27 LAB — COMPREHENSIVE METABOLIC PANEL
ALT: 19 U/L (ref 0–44)
AST: 18 U/L (ref 15–41)
Albumin: 3.9 g/dL (ref 3.5–5.0)
Alkaline Phosphatase: 52 U/L (ref 38–126)
Anion gap: 13 (ref 5–15)
BUN: 9 mg/dL (ref 6–20)
CO2: 18 mmol/L — ABNORMAL LOW (ref 22–32)
Calcium: 8.6 mg/dL — ABNORMAL LOW (ref 8.9–10.3)
Chloride: 112 mmol/L — ABNORMAL HIGH (ref 98–111)
Creatinine, Ser: 0.74 mg/dL (ref 0.44–1.00)
GFR, Estimated: >60 mL/min (ref >60)
Glucose, Bld: 98 mg/dL (ref 70–99)
Potassium: 3.7 mmol/L (ref 3.5–5.1)
Sodium: 143 mmol/L (ref 135–145)
Total Bilirubin: 0.5 mg/dL (ref 0.3–1.2)
Total Protein: 6.9 g/dL (ref 6.5–8.1)

## 2023-08-27 LAB — HCG, SERUM, QUALITATIVE: Preg, Serum: NEGATIVE

## 2023-08-27 LAB — CBC
HCT: 37.4 % (ref 36.0–46.0)
Hemoglobin: 12.4 g/dL (ref 12.0–15.0)
MCH: 27.7 pg (ref 26.0–34.0)
MCHC: 33.2 g/dL (ref 30.0–36.0)
MCV: 83.7 fL (ref 80.0–100.0)
Platelets: 295 10*3/uL (ref 150–400)
RBC: 4.47 MIL/uL (ref 3.87–5.11)
RDW: 13.2 % (ref 11.5–15.5)
WBC: 9.8 10*3/uL (ref 4.0–10.5)
nRBC: 0 % (ref 0.0–0.2)

## 2023-08-27 LAB — LIPASE, BLOOD: Lipase: 23 U/L (ref 11–51)

## 2023-08-27 MED ORDER — ONDANSETRON HCL 4 MG/2ML IJ SOLN
INTRAMUSCULAR | Status: AC
  Filled 2023-08-27: qty 2

## 2023-08-27 MED ORDER — SODIUM CHLORIDE 0.9 % IV BOLUS
1000.0000 mL | Freq: Once | INTRAVENOUS | Status: AC
  Administered 2023-08-27: 1000 mL via INTRAVENOUS

## 2023-08-27 MED ORDER — ONDANSETRON HCL 4 MG/2ML IJ SOLN
4.0000 mg | Freq: Once | INTRAMUSCULAR | Status: AC
  Administered 2023-08-27: 4 mg via INTRAMUSCULAR

## 2023-08-27 NOTE — ED Notes (Signed)
Pt is currently vomiting in the exam room. She states she is too weak and can not walk to bathroom to get urine preg test done. Advised her we can't give meds to help nausea and vomiting until we confirm she is not pregnant

## 2023-08-27 NOTE — ED Notes (Signed)
[facility] called back and states they will send a truck in about 20 mins they are changing shifts

## 2023-08-27 NOTE — ED Notes (Signed)
Called charge nurse megan to make aware being transported to [facility]

## 2023-08-27 NOTE — ED Notes (Signed)
Patient is being discharged from the Urgent Care and sent to the Emergency Department via [facility] . Per  Wynonia Lawman, NP, patient is in need of higher level of care due to Abdominal pain and Vomiting. Patient is aware and verbalizes understanding of plan of care.  Vitals:   08/27/23 1821  BP: 129/83  Pulse: 80  Resp: 20  Temp: (!) 97.4 F (36.3 C)  SpO2: 99%

## 2023-08-27 NOTE — ED Triage Notes (Signed)
Patient BIB [facility] from UC for vomiting. UC reported intractable vomiting to [facility], patient reports she has been vomiting since around lunch. 20 L AC, 1L NS running at this time, 4mg  zofran administered at [facility] as well. BP 139/77 HR 60, RR 16, 100% RA.

## 2023-08-27 NOTE — ED Notes (Signed)
Called [facility] for [place]

## 2023-08-27 NOTE — ED Triage Notes (Signed)
Pt states she has had nausea and vomiting all day, she is fatigued and weak from vomiting. She hasn't taken any meds.    She would like STI testing including blood work. She states she has some vaginal discharge that is yellow.

## 2023-08-27 NOTE — ED Provider Triage Note (Signed)
Emergency Medicine Provider Triage Evaluation Note  Marie Mathews , a 27 y.o. female  was evaluated in triage.  Pt complains of nausea and vomiting.  Seen at urgent care for same and apparently was complaining of abdominal pain and was sent here for evaluation.  IV was administered along with Zofran and fluids at urgent care prior to being transported here.  Upon my evaluation, patient is well-appearing no longer vomiting and states that her pain is gone as well.  Does note that she smokes marijuana and consumed a high amount of liquor last night.  Review of Systems  Positive:  Negative:   Physical Exam  BP (!) 134/90 (BP Location: Right Arm)   Pulse (!) 55   Temp 98.5 F (36.9 C) (Oral)   Resp (!) 24   Ht 5\' 5"  (1.651 m)   Wt 93 kg   LMP 08/23/2023 Comment: has implant but it is expired.  SpO2 100%   BMI 34.11 kg/m  Gen:   Awake, no distress   Resp:  Normal effort  MSK:   Moves extremities without difficulty  Other:    Medical Decision Making  Medically screening exam initiated at 8:08 PM.  Appropriate orders placed.  Marie Mathews was informed that the remainder of the evaluation will be completed by another provider, this initial triage assessment does not replace that evaluation, and the importance of remaining in the ED until their evaluation is complete.     Vear Clock 08/27/23 2009

## 2023-08-27 NOTE — ED Provider Notes (Signed)
Patient originally checked in for STD testing due to experiencing some vaginal discharge.  Patient endorses nausea and vomiting that began today with severe abdominal pain.  Upon assessment patient is difficult to assess due to writhing around in pain and actively vomiting.  Patient is very pale and diaphoretic upon assessment.  Patient is tender to generalized abdomen with guarding.  Patient was only able to answer yes or no questions by nodding her head regarding symptoms due to actively vomiting and pain.  Ordered IV fluids and Zofran.  Patient arrives here via [facility] with her significant other.  Patient is too weak to ambulate without assist at this time.  Clinical staff called [facility] to transport patient to ER for further evaluation due to toxic appearance and symptomology.  Patient agreeable to plan at this time.   Wynonia Lawman A, NP 08/27/23 1840

## 2023-08-28 DIAGNOSIS — R112 Nausea with vomiting, unspecified: Secondary | ICD-10-CM | POA: Diagnosis not present

## 2023-08-28 LAB — WET PREP, GENITAL
Sperm: NONE SEEN
Trich, Wet Prep: NONE SEEN
WBC, Wet Prep HPF POC: <10 (ref <10)
Yeast Wet Prep HPF POC: NONE SEEN

## 2023-08-28 LAB — GC/CHLAMYDIA PROBE AMP (~~LOC~~) NOT AT ARMC
Chlamydia: NEGATIVE
Comment: NEGATIVE
Comment: NORMAL
Neisseria Gonorrhea: NEGATIVE

## 2023-08-28 LAB — HIV ANTIBODY (ROUTINE TESTING W REFLEX): HIV Screen 4th Generation wRfx: NONREACTIVE

## 2023-08-28 LAB — RPR: RPR Ser Ql: NONREACTIVE

## 2023-08-28 MED ORDER — METOCLOPRAMIDE HCL 5 MG/ML IJ SOLN
5.0000 mg | Freq: Once | INTRAMUSCULAR | Status: AC
  Administered 2023-08-28: 5 mg via INTRAVENOUS
  Filled 2023-08-28: qty 2

## 2023-08-28 MED ORDER — LACTATED RINGERS IV BOLUS
1000.0000 mL | Freq: Once | INTRAVENOUS | Status: AC
  Administered 2023-08-28: 1000 mL via INTRAVENOUS

## 2023-08-28 MED ORDER — ONDANSETRON 4 MG PO TBDP: 4.0000 mg | ORAL_TABLET | Freq: Four times a day (QID) | ORAL | 0 refills | Status: DC | PRN

## 2023-08-28 MED ORDER — PANTOPRAZOLE SODIUM 40 MG IV SOLR
40.0000 mg | Freq: Once | INTRAVENOUS | Status: AC
  Administered 2023-08-28: 40 mg via INTRAVENOUS
  Filled 2023-08-28: qty 10

## 2023-08-28 MED ORDER — PANTOPRAZOLE SODIUM 40 MG PO TBEC: 40.0000 mg | DELAYED_RELEASE_TABLET | Freq: Every day | ORAL | 0 refills | Status: DC

## 2023-08-28 NOTE — ED Provider Notes (Signed)
[place] EMERGENCY DEPARTMENT AT [facility] Provider Note   CSN: [id number] Arrival date & time: 08/27/23  1937     History  No chief complaint on file.   Marie Mathews is a 27 y.o. female.  27 year old female that had gone to urgent care for STD check (routine), pessary placement and birth control removal but drank too much tequila last night and was throwing up and appeared pale so sent here for evaluation instead.  She has Zofran and fluids with them she states since then her nausea is gone but she still has no appetite.  Urinating okay.  No vaginal symptoms.        Home Medications Prior to Admission medications   Medication Sig Start Date End Date Taking? Authorizing Provider  pantoprazole (PROTONIX) 40 MG tablet Take 1 tablet (40 mg total) by mouth daily. 08/28/23  Yes Elvin Banker, Barbara Cower, MD  ondansetron (ZOFRAN ODT) 4 MG disintegrating tablet Take 1 tablet (4 mg total) by mouth every 6 (six) hours as needed for nausea or vomiting. 08/28/23   Jonasia Coiner, Barbara Cower, MD      Allergies    Bee venom    Review of Systems   Review of Systems  Physical Exam Updated Vital Signs BP (!) 135/94   Pulse 60   Temp 98.2 F (36.8 C)   Resp 18   Ht 5\' 5"  (1.651 m)   Wt 93 kg   LMP 08/23/2023 Comment: has implant but it is expired.  SpO2 100%   BMI 34.11 kg/m  Physical Exam Vitals and nursing note reviewed.  Constitutional:      Appearance: She is well-developed.  HENT:     Head: Normocephalic and atraumatic.  Eyes:     Pupils: Pupils are equal, round, and reactive to light.  Cardiovascular:     Rate and Rhythm: Normal rate and regular rhythm.  Pulmonary:     Effort: No respiratory distress.     Breath sounds: No stridor.  Abdominal:     General: There is no distension.  Musculoskeletal:     Cervical back: Normal range of motion.  Skin:    General: Skin is warm and dry.  Neurological:     General: No focal deficit present.     Mental Status: She is alert.      ED Results / Procedures / Treatments   Labs (all labs ordered are listed, but only abnormal results are displayed) Labs Reviewed  WET PREP, GENITAL - Abnormal; Notable for the following components:      Result Value   Clue Cells Wet Prep HPF POC PRESENT (*)    All other components within normal limits  COMPREHENSIVE METABOLIC PANEL - Abnormal; Notable for the following components:   Chloride 112 (*)    CO2 18 (*)    Calcium 8.6 (*)    All other components within normal limits  LIPASE, BLOOD  CBC  HCG, SERUM, QUALITATIVE  URINALYSIS, ROUTINE W REFLEX MICROSCOPIC  RPR  HIV ANTIBODY (ROUTINE TESTING W REFLEX)  GC/CHLAMYDIA PROBE AMP ([place]) NOT AT [facility]    EKG None  Radiology No results found.  Procedures Procedures    Medications Ordered in ED Medications  pantoprazole (PROTONIX) injection 40 mg (40 mg Intravenous Given 08/28/23 0353)  lactated ringers bolus 1,000 mL (0 mLs Intravenous Stopped 08/28/23 0619)  metoCLOPramide (REGLAN) injection 5 mg (5 mg Intravenous Given 08/28/23 0352)    ED Course/ Medical Decision Making/ A&P  Medical Decision Making Amount and/or Complexity of Data Reviewed Labs: ordered.  Risk Prescription drug management.   Abdomen benign and labs reassuring aside from being mildly low bicarb and low.  Suspect alcohol gastritis is improved this point.  Protonix given.  She does some blood streaking in her vomit earlier however hemoglobin stable with that this is probably just from gastritis versus Mallory-Weiss.  Wet prep positive for bacterial vaginosis but as she is asymptomatic we will defer to.  She will follow-up with a gynecologist for the rest of her reproductive concerns.  Cultures pending at time of discharge but asymptoamtic so will defer treatment.  Prescription sent.   Final Clinical Impression(s) / ED Diagnoses Final diagnoses:  Nausea and vomiting, unspecified vomiting type    Rx /  DC Orders ED Discharge Orders          Ordered    ondansetron (ZOFRAN ODT) 4 MG disintegrating tablet  Every 6 hours PRN        08/28/23 0648    pantoprazole (PROTONIX) 40 MG tablet  Daily        08/28/23 0648              Haruki Arnold, Barbara Cower, MD 08/28/23 [phone]

## 2023-08-28 NOTE — ED Notes (Signed)
Patient stated that she is unable to void at this time, aware we need a urinalysis, Labeled cup is at bedside.

## 2023-11-11 ENCOUNTER — Encounter: Payer: Self-pay | Admitting: Obstetrics & Gynecology

## 2023-11-11 ENCOUNTER — Other Ambulatory Visit: Payer: Self-pay

## 2023-11-11 ENCOUNTER — Other Ambulatory Visit (HOSPITAL_COMMUNITY)
Admission: RE | Admit: 2023-11-11 | Discharge: 2023-11-11 | Disposition: A | Discharge status: Home / Self Care | Payer: Medicaid Other | Source: Ambulatory Visit | Attending: Obstetrics & Gynecology | Admitting: Obstetrics & Gynecology

## 2023-11-11 ENCOUNTER — Ambulatory Visit: Payer: Medicaid Other | Admitting: Obstetrics & Gynecology

## 2023-11-11 VITALS — BP 113/78 | HR 92 | Wt 195.9 lb

## 2023-11-11 DIAGNOSIS — Z01419 Encounter for gynecological examination (general) (routine) without abnormal findings: Secondary | ICD-10-CM

## 2023-11-11 DIAGNOSIS — Z1331 Encounter for screening for depression: Secondary | ICD-10-CM

## 2023-11-11 DIAGNOSIS — Z3046 Encounter for surveillance of implantable subdermal contraceptive: Secondary | ICD-10-CM

## 2023-11-11 NOTE — Patient Instructions (Signed)
Nexplanon Instructions After Removal  Keep bandage clean and dry for 24 hours  May use ice/Tylenol/Ibuprofen for soreness or pain  If you develop fever, drainage or increased warmth from incision site-contact office immediately   

## 2023-11-11 NOTE — Progress Notes (Signed)
GYNECOLOGY ANNUAL PREVENTATIVE CARE ENCOUNTER NOTE  History:     Marie Mathews is a 27 y.o. G31P1101 female here for Nexplanon removal and routine annual gynecologic exam.  Current complaints: none.   Denies abnormal vaginal bleeding, discharge, pelvic pain, problems with intercourse or other gynecologic concerns.    Gynecologic History Patient's last menstrual period was 11/10/2023. Contraception: Nexplanon placed 2019. Last Pap: Unsure. Result was normal  Obstetric History OB History  Gravida Para Term Preterm AB Living  2 2 1 1  0 1  SAB IAB Ectopic Multiple Live Births  0   0 1    # Outcome Date GA Lbr Len/2nd Weight Sex Type Anes PTL Lv  2 Term 12/23/17 [redacted]w[redacted]d 21:40 / 00:28 8 lb 2.9 oz (3.711 kg) F Vag-Spont EPI  LIV  1 Preterm 10/15/16 [redacted]w[redacted]d       FD    Past Medical History:  Diagnosis Date   Blood type, Rh negative 12/12/2017   Herniated disc, cervical    Sickle cell trait (HCC) 12/13/2017    History reviewed. No pertinent surgical history.  No current outpatient medications on file prior to visit.   No current facility-administered medications on file prior to visit.    Allergies  Allergen Reactions   Bee Venom Anaphylaxis    Social History:  reports that she has never smoked. She has never used smokeless tobacco. She reports that she does not drink alcohol and does not use drugs.  History reviewed. No pertinent family history.  The following portions of the patient's history were reviewed and updated as appropriate: allergies, current medications, past family history, past medical history, past social history, past surgical history and problem list.  Review of Systems Pertinent items noted in HPI and remainder of comprehensive ROS otherwise negative.  Physical Exam:  BP 113/78   Pulse 92   Wt 195 lb 14.4 oz (88.9 kg)   LMP 11/10/2023   BMI 32.60 kg/m  CONSTITUTIONAL: Well-developed, well-nourished female in no acute distress.  HENT:   Normocephalic, atraumatic, External right and left ear normal.  EYES: Conjunctivae and EOM are normal. Pupils are equal, round, and reactive to light. No scleral icterus.  NECK: Normal range of motion, supple, no masses.  Normal thyroid.  SKIN: Skin is warm and dry. No rash noted. Not diaphoretic. No erythema. No pallor. MUSCULOSKELETAL: Normal range of motion. No tenderness.  No cyanosis, clubbing, or edema. NEUROLOGIC: Alert and oriented to person, place, and time. Normal reflexes, muscle tone coordination.  PSYCHIATRIC: Normal mood and affect. Normal behavior. Normal judgment and thought content. CARDIOVASCULAR: Normal heart rate noted, regular rhythm RESPIRATORY: Clear to auscultation bilaterally. Effort and breath sounds normal, no problems with respiration noted. BREASTS: Symmetric in size. No masses, tenderness, skin changes, nipple drainage, or lymphadenopathy bilaterally. Performed in the presence of a chaperone. ABDOMEN: Soft, no distention noted.  No tenderness, rebound or guarding.  PELVIC: Normal appearing external genitalia and urethral meatus; normal appearing vaginal mucosa and cervix.  Bloody vaginal discharge noted, wiped off with fox swabs.  Pap smear obtained.  Normal uterine size, no other palpable masses, no uterine or adnexal tenderness.  Performed in the presence of a chaperone.  Nexplanon Removal Patient identified, informed consent performed, consent signed.   Appropriate time out taken. Nexplanon site identified on left arm.  Area prepped in usual sterile fashon. One ml of 1% lidocaine was used to anesthetize the area at the distal end of the implant. A small stab incision was made  right beside the implant on the distal portion.  The Nexplanon rod was grasped using hemostats and removed without difficulty.  There was minimal blood loss. There were no complications.  3 ml of 1% lidocaine was injected around the incision for post-procedure analgesia.  Steri-strips were applied  over the small incision.  A pressure bandage was applied to reduce any bruising.  The patient tolerated the procedure well and was given post procedure instructions.  Patient is planning to attempt conception.   Assessment and Plan:    1. Encounter for Nexplanon removal Nexplanon removed.  She is planning for pregnancy.  Will start taking multivitamins with folic acid supplement, avoid teratogens, use ovulation tracker apps and ovulation prediction kits as needed. However, if they are having troubles with conception after six months of trying, further evaluation with HSG, semen analysis or referral to [facility] specialist would be recommended. Optimization of other health issues recommended.    2. Well woman exam with routine gynecological exam (Primary) - Cytology - PAP Will follow up results of pap smear and manage accordingly. Normal breast examination today, she was advised to perform periodic self breast examinations.   Routine preventative health maintenance measures emphasized. Please refer to After Visit Summary for other counseling recommendations.      Jaynie Collins, MD, FACOG Obstetrician & Gynecologist, [facility] for [employer], [facility]

## 2023-11-14 LAB — CYTOLOGY - PAP
Chlamydia: NEGATIVE
Comment: NEGATIVE
Comment: NEGATIVE
Comment: NEGATIVE
Comment: NEGATIVE
Comment: NEGATIVE
Comment: NORMAL
HPV 16: NEGATIVE
HPV 18 / 45: POSITIVE — AB
High risk HPV: POSITIVE — AB
Neisseria Gonorrhea: NEGATIVE
Trichomonas: NEGATIVE

## 2023-11-15 ENCOUNTER — Encounter: Payer: Self-pay | Admitting: Obstetrics & Gynecology

## 2023-11-15 ENCOUNTER — Telehealth: Payer: Self-pay | Admitting: *Deleted

## 2023-11-15 DIAGNOSIS — R87612 Low grade squamous intraepithelial lesion on cytologic smear of cervix (LGSIL): Secondary | ICD-10-CM | POA: Insufficient documentation

## 2023-11-15 NOTE — Telephone Encounter (Signed)
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 Please call to inform patient of results and need for appointment.

## 2023-11-15 NOTE — Telephone Encounter (Signed)
 Attempted to call patient but heard message " Voicemail is full and cannot accept messages". Will send to inbox for staff to attempt again next week. Nancy Fetter

## 2023-11-21 NOTE — Telephone Encounter (Signed)
 Attempted to contact pt unable to leave voicemail as it is full.  MyChart message sent.  Letter sent.    Marie Mathews  11/21/23

## 2023-11-27 ENCOUNTER — Encounter: Payer: Self-pay | Admitting: *Deleted

## 2023-12-27 ENCOUNTER — Other Ambulatory Visit (HOSPITAL_COMMUNITY)
Admission: RE | Admit: 2023-12-27 | Discharge: 2023-12-27 | Disposition: A | Discharge status: Home / Self Care | Payer: Medicaid Other | Source: Ambulatory Visit | Attending: Obstetrics & Gynecology | Admitting: Obstetrics & Gynecology

## 2023-12-27 ENCOUNTER — Ambulatory Visit: Payer: Medicaid Other | Admitting: Obstetrics & Gynecology

## 2023-12-27 VITALS — BP 120/88 | HR 75 | Wt 192.8 lb

## 2023-12-27 DIAGNOSIS — R87612 Low grade squamous intraepithelial lesion on cytologic smear of cervix (LGSIL): Secondary | ICD-10-CM | POA: Diagnosis present

## 2023-12-27 DIAGNOSIS — Z3201 Encounter for pregnancy test, result positive: Secondary | ICD-10-CM | POA: Diagnosis not present

## 2023-12-27 LAB — POCT PREGNANCY, URINE: Preg Test, Ur: NEGATIVE

## 2023-12-27 NOTE — Patient Instructions (Signed)
COLPOSCOPY POST-PROCEDURE INSTRUCTIONS  You may take Ibuprofen, Aleve or Tylenol for cramping if needed.  If Monsel's solution was used, you will have a black discharge.  Light bleeding is normal.  If bleeding is heavier than your period, please call.  Put nothing in your vagina until the bleeding or discharge stops (usually 2 or3 days).  We will call you within one week with biopsy results

## 2023-12-27 NOTE — Progress Notes (Signed)
    GYNECOLOGY OFFICE COLPOSCOPY PROCEDURE NOTE  28 y.o. 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 Discussed role for HPV in cervical dysplasia, need for surveillance. Recommended HPV vaccine series, she will consider this and information was given to her to review.  Patient gave informed written consent, time out was performed.  Placed in lithotomy position. Cervix viewed with speculum and colposcope after application of acetic acid.   Colposcopy adequate? Yes Acetowhite lesions noted at 3 and 6 o'clock; corresponding biopsies obtained.  ECC specimen obtained. All specimens were labeled and sent to pathology.  Chaperone was present during entire procedure.  Patient was given post procedure instructions.  Will follow up pathology and manage accordingly; patient will be contacted with results and recommendations.  Routine preventative health maintenance measures emphasized.    Jaynie Collins, MD, FACOG Obstetrician & Gynecologist, [facility] for [employer], [facility]

## 2023-12-30 LAB — SURGICAL PATHOLOGY

## 2023-12-31 ENCOUNTER — Encounter: Payer: Self-pay | Admitting: Obstetrics & Gynecology

## 2024-01-02 ENCOUNTER — Encounter: Payer: Self-pay | Admitting: General Practice

## 2024-04-27 ENCOUNTER — Encounter: Payer: Self-pay | Admitting: General Practice

## 2024-05-04 ENCOUNTER — Ambulatory Visit: Admitting: Family Medicine

## 2024-05-26 ENCOUNTER — Ambulatory Visit: Admitting: Family Medicine

## 2024-06-23 ENCOUNTER — Ambulatory Visit: Admitting: Family Medicine

## 2024-07-06 ENCOUNTER — Ambulatory Visit: Admitting: Family Medicine

## 2024-08-12 ENCOUNTER — Ambulatory Visit: Admitting: Family Medicine

## 2024-09-23 ENCOUNTER — Encounter (HOSPITAL_COMMUNITY): Payer: Self-pay

## 2024-09-23 ENCOUNTER — Other Ambulatory Visit: Payer: Self-pay

## 2024-09-23 ENCOUNTER — Ambulatory Visit (HOSPITAL_COMMUNITY)
Admission: RE | Admit: 2024-09-23 | Discharge: 2024-09-23 | Disposition: A | Discharge status: Home / Self Care | Source: Ambulatory Visit | Attending: Internal Medicine | Admitting: Internal Medicine

## 2024-09-23 VITALS — BP 102/71 | HR 72 | Temp 99.3°F | Resp 16

## 2024-09-23 DIAGNOSIS — Z3201 Encounter for pregnancy test, result positive: Secondary | ICD-10-CM | POA: Diagnosis not present

## 2024-09-23 DIAGNOSIS — N898 Other specified noninflammatory disorders of vagina: Secondary | ICD-10-CM | POA: Diagnosis present

## 2024-09-23 DIAGNOSIS — Z113 Encounter for screening for infections with a predominantly sexual mode of transmission: Secondary | ICD-10-CM | POA: Insufficient documentation

## 2024-09-23 LAB — POCT URINALYSIS DIP (MANUAL ENTRY)
Bilirubin, UA: NEGATIVE
Blood, UA: NEGATIVE
Glucose, UA: NEGATIVE mg/dL
Nitrite, UA: NEGATIVE
Protein Ur, POC: 100 mg/dL — AB
Spec Grav, UA: 1.02 (ref 1.010–1.025)
Urobilinogen, UA: 0.2 U/dL
pH, UA: 6.5 (ref 5.0–8.0)

## 2024-09-23 LAB — POCT URINE PREGNANCY: Preg Test, Ur: POSITIVE — AB

## 2024-09-23 NOTE — Discharge Instructions (Signed)
 Your pregnancy test is positive.  - Refer to the list of safe medications in pregnancy you were given at urgent care today and do not take any other medications over-the-counter other than what is on this list.  You may only take Tylenol  for pain and discomfort/fever.  Do not take ibuprofen  in pregnancy.  - Drink plenty of water (at least 8 cups/day) to stay well-hydrated.  - Purchase a prenatal vitamin over the counter and take this once daily.  - Avoid smoking cigarettes, marijuana, and other drug use. Do not drink alcohol while pregnant.  If you develop any severe abdominal pain, severe low back pain, vaginal bleeding, or any other pregnancy related emergency, please go to the maternity assessment unit located at entrance C of [facility].  Call the OB/GYN listed below to schedule an appointment to establish care so that you can be monitored throughout your pregnancy.   Center for [facility] [employer] [facility]            [address] [place], [place] [address] [phone]   Center for [facility] at [facility]                                                            [address], [address], [place], [place], [address] [phone]  STD testing pending, this will take 2-3 days to result. We will only call you if your testing is positive for any infection(s) and we will provide treatment.  Avoid sexual intercourse until your STD results come back.  If any of your STD results are positive, you will need to avoid sexual intercourse for 7 days while you are being treated to prevent spread of STD.  Condom use is the best way to prevent spread of STDs. Notify partner(s) of any positive results.  Return to urgent care as needed.

## 2024-09-23 NOTE — Medical Student Note (Signed)
 [facility] Insurance Account Manager Note For educational purposes for Medical, PA and NP students only and not part of the legal medical record.   CSN: [id number] Arrival date & time: 09/23/24  1510      History   Chief Complaint Chief Complaint  Patient presents with   SEXUALLY TRANSMITTED DISEASE    I'm having symptoms, as well as my period not coming on. - Entered by patient   Appointment    3:00    HPI Marie Mathews is a 28 y.o. female.  Pt with a non-contributory medical history presents with 3 days of white, lumpy vaginal discharge that is malodorous along with itching and swelling to her labia. She has been sexually active with 2 partners and has not consistently used protection. She is also concerned for possible pregnancy as she had not had a menstrual cycle since around Labor day. She has had some vague, mild abd cramping that has been intermittent. She denies any fever, chills, sore thorat, dysuria, N/V. She is requesting STI testing of both cervicovaginal and throat specimens. She reports that she has had a occasional productive cough with yellow sputum for the last month and a half. She denies any ShOB. Pt is a former tobacco smoker.   The history is provided by the patient.    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 No pertinent surgical history.  OB History     Gravida  3   Para  2   Term  1   Preterm  1   AB  0   Living  1      SAB  0   IAB      Ectopic      Multiple  0   Live Births  1            Home Medications    Prior to Admission medications   Not on File    Family History History reviewed. No pertinent family history.  Social History Social History    Tobacco Use   Smoking status: Never   Smokeless tobacco: Never  Vaping Use   Vaping status: Never Used  Substance Use Topics   Alcohol use: Yes   Drug use: No     Allergies   Bee venom   Review of Systems Review of Systems  Constitutional:  Negative for chills and fever.  HENT:  Negative for sore throat.   Respiratory:  Positive for cough. Negative for chest tightness and shortness of breath.   Gastrointestinal:  Positive for abdominal pain. Negative for nausea and vomiting.  Genitourinary:  Positive for vaginal discharge. Negative for dysuria, frequency and urgency.     Physical Exam Updated Vital Signs BP 102/71 (BP Location: Right Arm)   Pulse 72   Temp 99.3 F (37.4 C) (Oral)   Resp 16   LMP 07/14/2024 (Approximate)   SpO2 97%   Physical Exam Vitals and nursing note reviewed.  Constitutional:      Appearance: Normal appearance.  HENT:     Mouth/Throat:     Mouth: Mucous membranes are moist.     Pharynx: Oropharynx is clear. No posterior oropharyngeal erythema.  Cardiovascular:  Rate and Rhythm: Normal rate and regular rhythm.     Heart sounds: Normal heart sounds. No murmur heard.    No friction rub. No gallop.  Pulmonary:     Effort: Pulmonary effort is normal.     Breath sounds: Normal breath sounds.  Abdominal:     General: Bowel sounds are normal.     Palpations: Abdomen is soft. There is no mass.     Tenderness: There is no abdominal tenderness. There is no guarding.  Musculoskeletal:     Cervical back: Neck supple.  Lymphadenopathy:     Cervical: No cervical adenopathy.  Neurological:     Mental Status: She is alert and oriented to person, place, and time.  Psychiatric:        Mood and Affect: Mood normal.        Behavior: Behavior normal.      ED Treatments / Results  Labs (all labs ordered are listed, but only abnormal results are displayed) Labs Reviewed  POCT URINALYSIS DIP (MANUAL ENTRY) - Abnormal; Notable for the following  components:      Result Value   Color, UA Cariann Kinnamon (*)    Clarity, UA cloudy (*)    Ketones, POC UA trace (5) (*)    Protein Ur, POC =100 (*)    Leukocytes, UA Small (1+) (*)    All other components within normal limits  POCT URINE PREGNANCY - Abnormal; Notable for the following components:   Preg Test, Ur Positive (*)    All other components within normal limits  URINE CULTURE    EKG  Radiology No results found.  Procedures Procedures (including critical care time)  Medications Ordered in ED Medications - No data to display   Initial Impression / Assessment and Plan / ED Course  I have reviewed the triage vital signs and the nursing notes.  Pertinent labs & imaging results that were available during my care of the patient were reviewed by me and considered in my medical decision making (see chart for details).    POC urine pregnancy is positive. POC urine dip was positive for leukocytes and protein, and trace ketones.   Cervicovaginal cytology pending.   Vulvovaginitis. Discharge is likely bacterial vaginosis versus candidiasis, however in the setting of pregnancy I would like to wait for definitive diagnosis from labs to direct specific treatment.   Notified patient of positive pregnancy test. Counseled patient on medication safety during pregnancy and provided patient with resources. Encouraged pt to follow up with OB/GYN for soonest available appointment. Pt became tearful during encounter and patient declined to continue with collection of HIV, RPR and providing throat specimen at this time.  Pt is agreeable to plan at this juncture. Red flag symptoms reviewed and return precautions given.     Final Clinical Impressions(s) / ED Diagnoses   Final diagnoses:  None    New Prescriptions New Prescriptions   No medications on file

## 2024-09-23 NOTE — ED Provider Notes (Signed)
 [facility]    CSN: [id number] Arrival date & time: 09/23/24  1510      History   Chief Complaint Chief Complaint  Patient presents with   SEXUALLY TRANSMITTED DISEASE    I'm having symptoms, as well as my period not coming on. - Entered by patient   Appointment    3:00    HPI Marie Mathews is a 28 y.o. female.   Marie Mathews is a 28 y.o. female presenting for chief complaint of vaginal discharge and vaginal odor that started a few days ago. Denies vaginal itching, rash, urinary symptoms, dizziness, flank pain, headaches, and known exposures to STDs. She has been sexually active with 2 partners in the last month, both female, one protected, the other without protection.  She has not had a menstrual cycle since 07/13/2024, she does not know her pregnancy status.  Reports intermittent generalized abdominal discomfort. Normal stools reported without blood/mucous in the stool, no diarrhea. Denies back pain, vaginal bleeding. She has had a few episodes of vomiting in the last week, tolerating fluids and foods without vomiting in the last 24 hours.      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 No pertinent surgical history.  OB History     Gravida  3   Para  2   Term  1   Preterm  1   AB  0   Living  1      SAB  0   IAB      Ectopic      Multiple  0   Live Births  1            Home Medications    Prior to Admission medications   Not on File    Family History History reviewed. No pertinent family history.  Social History Social History   Tobacco Use   Smoking status: Never   Smokeless tobacco: Never  Vaping Use   Vaping status: Never Used  Substance Use Topics   Alcohol use:  Yes   Drug use: No     Allergies   Bee venom   Review of Systems Review of Systems Per HPI  Physical Exam Triage Vital Signs ED Triage Vitals  Encounter Vitals Group     BP 09/23/24 1543 102/71     Girls Systolic BP Percentile --      Girls Diastolic BP Percentile --      Boys Systolic BP Percentile --      Boys Diastolic BP Percentile --      Pulse Rate 09/23/24 1543 72     Resp 09/23/24 1543 16     Temp 09/23/24 1543 99.3 F (37.4 C)     Temp Source 09/23/24 1543 Oral     SpO2 09/23/24 1543 97 %     Weight --      Height --      Head Circumference --      Peak Flow --      Pain Score 09/23/24 1540 0     Pain Loc --      Pain Education --      Exclude from Growth Chart --    No  data found.  Updated Vital Signs BP 102/71 (BP Location: Right Arm)   Pulse 72   Temp 99.3 F (37.4 C) (Oral)   Resp 16   LMP 07/14/2024 (Approximate)   SpO2 97%   Visual Acuity Right Eye Distance:   Left Eye Distance:   Bilateral Distance:    Right Eye Near:   Left Eye Near:    Bilateral Near:     Physical Exam Vitals and nursing note reviewed.  Constitutional:      Appearance: She is not ill-appearing or toxic-appearing.  HENT:     Head: Normocephalic and atraumatic.     Right Ear: Hearing and external ear normal.     Left Ear: Hearing and external ear normal.     Nose: Nose normal.     Mouth/Throat:     Lips: Pink.  Eyes:     General: Lids are normal. Vision grossly intact. Gaze aligned appropriately.     Extraocular Movements: Extraocular movements intact.     Conjunctiva/sclera: Conjunctivae normal.  Pulmonary:     Effort: Pulmonary effort is normal.  Abdominal:     General: Bowel sounds are normal.     Palpations: Abdomen is soft.     Tenderness: There is no abdominal tenderness. There is no right CVA tenderness, left CVA tenderness or guarding.  Musculoskeletal:     Cervical back: Neck supple.  Skin:    General: Skin is warm and dry.     Capillary  Refill: Capillary refill takes less than 2 seconds.     Findings: No rash.  Neurological:     General: No focal deficit present.     Mental Status: She is alert and oriented to person, place, and time. Mental status is at baseline.     Cranial Nerves: No dysarthria or facial asymmetry.  Psychiatric:        Mood and Affect: Mood normal.        Speech: Speech normal.        Behavior: Behavior normal.        Thought Content: Thought content normal.        Judgment: Judgment normal.      UC Treatments / Results  Labs (all labs ordered are listed, but only abnormal results are displayed) Labs Reviewed  POCT URINALYSIS DIP (MANUAL ENTRY) - Abnormal; Notable for the following components:      Result Value   Color, UA brown (*)    Clarity, UA cloudy (*)    Ketones, POC UA trace (5) (*)    Protein Ur, POC =100 (*)    Leukocytes, UA Small (1+) (*)    All other components within normal limits  POCT URINE PREGNANCY - Abnormal; Notable for the following components:   Preg Test, Ur Positive (*)    All other components within normal limits  URINE CULTURE  HIV ANTIBODY (ROUTINE TESTING W REFLEX)  RPR  CERVICOVAGINAL ANCILLARY ONLY  CYTOLOGY, (ORAL, ANAL, URETHRAL) ANCILLARY ONLY    EKG   Radiology No results found.  Procedures Procedures (including critical care time)  Medications Ordered in UC Medications - No data to display  Initial Impression / Assessment and Plan / UC Course  I have reviewed the triage vital signs and the nursing notes.  Pertinent labs & imaging results that were available during my care of the patient were reviewed by me and considered in my medical decision making (see chart for details).   1. Positive pregnancy test Urine pregnancy test is positive  in the clinic.   Prenatal vitamin daily, stop ibuprofen , may take tylenol  for pain instead.  Given list of safe OTC medications in pregnancy.  OB/GYN provider list given, advised to schedule appointment  for prenatal care MAU precautions given, no current red flag signs of pregnancy related emergency.  Urinalysis shows small leukocytes, trace ketones, small protein, and cloudy urine without nitrites or hematuria. Urine culture pending. Suspect leukes are coming from potential vaginal infection like BV. Will wait to treat asymptomatic bacteruria in pregnancy if necessary per protocol when urine culture results.   STI labs pending, will notify patient of positive results and treat accordingly per protocol when labs result.  Patient would like HIV and syphilis testing today.   Patient to avoid sexual intercourse until screening testing comes back.   Education provided regarding safe sexual practices and patient encouraged to use protection to prevent spread of STIs.    Counseled patient on potential for adverse effects with medications prescribed/recommended today, strict ER and return-to-clinic precautions discussed, patient verbalized understanding.    Final Clinical Impressions(s) / UC Diagnoses   Final diagnoses:  Positive pregnancy test  Screening for STD (sexually transmitted disease)  Vaginal discharge     Discharge Instructions      Your pregnancy test is positive.  - Refer to the list of safe medications in pregnancy you were given at urgent care today and do not take any other medications over-the-counter other than what is on this list.  You may only take Tylenol  for pain and discomfort/fever.  Do not take ibuprofen  in pregnancy.  - Drink plenty of water (at least 8 cups/day) to stay well-hydrated.  - Purchase a prenatal vitamin over the counter and take this once daily.  - Avoid smoking cigarettes, marijuana, and other drug use. Do not drink alcohol while pregnant.  If you develop any severe abdominal pain, severe low back pain, vaginal bleeding, or any other pregnancy related emergency, please go to the maternity assessment unit located at entrance C of [facility].  Call the OB/GYN listed below to schedule an appointment to establish care so that you can be monitored throughout your pregnancy.   Center for [facility] [employer] [facility]            [address] [place], [place] [address] [phone]   Center for [facility] at [facility]                                                            [address], [address], [place], [place], [address] [phone]  STD testing pending, this will take 2-3 days to result. We will only call you if your testing is positive for any infection(s) and we will provide treatment.  Avoid sexual intercourse until your STD results come back.  If any of your STD results are positive, you will need to avoid sexual intercourse for 7 days while you are being treated to prevent spread of STD.  Condom use is the best way to prevent spread of STDs. Notify partner(s) of any positive results.  Return to urgent care as needed.    ED Prescriptions   None    PDMP not reviewed this encounter.   Enedelia Dorna HERO, [place] 09/25/24 2030

## 2024-09-23 NOTE — ED Triage Notes (Signed)
 Has had discharge and odor onset Sunday or Monday.  Has relations with her ex- this past weekend.  Has not had a period for the month of October.  Denies back pain or abdominal pain Nexplanon  was removed the summer

## 2024-09-24 ENCOUNTER — Ambulatory Visit (HOSPITAL_COMMUNITY): Payer: Self-pay

## 2024-09-24 LAB — CERVICOVAGINAL ANCILLARY ONLY
Bacterial Vaginitis (gardnerella): POSITIVE — AB
Candida Glabrata: NEGATIVE
Candida Vaginitis: POSITIVE — AB
Chlamydia: NEGATIVE
Comment: NEGATIVE
Comment: NEGATIVE
Comment: NEGATIVE
Comment: NEGATIVE
Comment: NEGATIVE
Comment: NORMAL
Neisseria Gonorrhea: NEGATIVE
Trichomonas: NEGATIVE

## 2024-09-24 LAB — URINE CULTURE: Culture: NO GROWTH

## 2024-09-24 MED ORDER — METRONIDAZOLE 0.75 % VA GEL: 1.0000 | Freq: Every day | VAGINAL | 0 refills | Status: AC

## 2024-09-24 MED ORDER — CLOTRIMAZOLE 3 2 % VA CREA: 1.0000 | TOPICAL_CREAM | Freq: Every day | VAGINAL | 0 refills | Status: AC

## 2024-10-07 ENCOUNTER — Other Ambulatory Visit: Payer: Self-pay

## 2024-10-07 ENCOUNTER — Inpatient Hospital Stay (HOSPITAL_COMMUNITY)

## 2024-10-07 ENCOUNTER — Encounter (HOSPITAL_COMMUNITY): Payer: Self-pay | Admitting: Obstetrics and Gynecology

## 2024-10-07 ENCOUNTER — Inpatient Hospital Stay (HOSPITAL_COMMUNITY)
Admission: AD | Admit: 2024-10-07 | Discharge: 2024-10-07 | Disposition: A | Discharge status: Home / Self Care | Attending: Obstetrics and Gynecology | Admitting: Obstetrics and Gynecology

## 2024-10-07 DIAGNOSIS — O26899 Other specified pregnancy related conditions, unspecified trimester: Secondary | ICD-10-CM

## 2024-10-07 DIAGNOSIS — Z3201 Encounter for pregnancy test, result positive: Secondary | ICD-10-CM | POA: Diagnosis present

## 2024-10-07 DIAGNOSIS — O209 Hemorrhage in early pregnancy, unspecified: Secondary | ICD-10-CM | POA: Diagnosis present

## 2024-10-07 DIAGNOSIS — O039 Complete or unspecified spontaneous abortion without complication: Secondary | ICD-10-CM | POA: Insufficient documentation

## 2024-10-07 LAB — COMPREHENSIVE METABOLIC PANEL WITH GFR
ALT: 19 U/L (ref 0–44)
AST: 22 U/L (ref 15–41)
Albumin: 3.5 g/dL (ref 3.5–5.0)
Alkaline Phosphatase: 37 U/L — ABNORMAL LOW (ref 38–126)
Anion gap: 12 (ref 5–15)
BUN: 5 mg/dL — ABNORMAL LOW (ref 6–20)
CO2: 22 mmol/L (ref 22–32)
Calcium: 8.7 mg/dL — ABNORMAL LOW (ref 8.9–10.3)
Chloride: 101 mmol/L (ref 98–111)
Creatinine, Ser: 0.6 mg/dL (ref 0.44–1.00)
GFR, Estimated: >60 mL/min (ref >60)
Glucose, Bld: 94 mg/dL (ref 70–99)
Potassium: 3.4 mmol/L — ABNORMAL LOW (ref 3.5–5.1)
Sodium: 135 mmol/L (ref 135–145)
Total Bilirubin: 0.5 mg/dL (ref 0.0–1.2)
Total Protein: 6.2 g/dL — ABNORMAL LOW (ref 6.5–8.1)

## 2024-10-07 LAB — CBC
HCT: 30.9 % — ABNORMAL LOW (ref 36.0–46.0)
Hemoglobin: 10.8 g/dL — ABNORMAL LOW (ref 12.0–15.0)
MCH: 28.6 pg (ref 26.0–34.0)
MCHC: 35 g/dL (ref 30.0–36.0)
MCV: 82 fL (ref 80.0–100.0)
Platelets: 251 K/uL (ref 150–400)
RBC: 3.77 MIL/uL — ABNORMAL LOW (ref 3.87–5.11)
RDW: 12.2 % (ref 11.5–15.5)
WBC: 8 K/uL (ref 4.0–10.5)
nRBC: 0 % (ref 0.0–0.2)

## 2024-10-07 MED ORDER — HYDROMORPHONE HCL 1 MG/ML IJ SOLN
1.0000 mg | Freq: Once | INTRAMUSCULAR | Status: AC
  Administered 2024-10-07: 1 mg via INTRAVENOUS
  Filled 2024-10-07: qty 1

## 2024-10-07 MED ORDER — METHYLERGONOVINE MALEATE 0.2 MG PO TABS: 0.2000 mg | ORAL_TABLET | Freq: Three times a day (TID) | ORAL | 0 refills | Status: AC

## 2024-10-07 MED ORDER — FERROUS SULFATE 325 (65 FE) MG PO TABS: 325.0000 mg | ORAL_TABLET | Freq: Every day | ORAL | 0 refills | Status: AC

## 2024-10-07 MED ORDER — LACTATED RINGERS IV BOLUS
1000.0000 mL | Freq: Once | INTRAVENOUS | Status: AC
  Administered 2024-10-07: 1000 mL via INTRAVENOUS

## 2024-10-07 MED ORDER — ACETAMINOPHEN 500 MG PO TABS: 1000.0000 mg | ORAL_TABLET | Freq: Four times a day (QID) | ORAL | Status: AC | PRN

## 2024-10-07 MED ORDER — IBUPROFEN 600 MG PO TABS: 600.0000 mg | ORAL_TABLET | Freq: Four times a day (QID) | ORAL | 0 refills | Status: AC | PRN

## 2024-10-07 MED ORDER — RHO D IMMUNE GLOBULIN 1500 UNIT/2ML IJ SOSY
300.0000 ug | PREFILLED_SYRINGE | Freq: Once | INTRAMUSCULAR | Status: AC
  Administered 2024-10-07: 300 ug via INTRAMUSCULAR
  Filled 2024-10-07: qty 2

## 2024-10-07 NOTE — MAU Note (Signed)
 Marie Mathews is a 28 y.o. at Unknown here in MAU reporting: Vaginal bleeding with clots that began at 0600 followed by 10/10 abdominal pain at 0700. Had positive Preg test 09/23/24 at [redacted] weeks gestation.  LMP: 07/13/2024 Onset of complaint: This morning Pain score: 10/10

## 2024-10-07 NOTE — MAU Note (Signed)
 RN called lab for update on status of ABO RH workup, States tech is working on it right now.  Pt updated on status.

## 2024-10-07 NOTE — MAU Provider Note (Signed)
 S/ HPI  [name] is a 28 y.o. [phone] patient who presents to MAU today with complaint of patient arrived via EMS complaining of vaginal bleeding with clots that began up around 6 AM this morning with pain rating 10 out of 10 in the lower abdominal area that started at 7 AM.  She reports the bleeding was heavy and she was soaking pads since 6 AM this morning and it is continuing.    Patient states she had a positive pregnancy test she believes to be about  [redacted] weeks pregnant  per patient.    Prenatal care has been unestablished with no records to review at this time.   HPI otherwise negative unless noted in HPI above  O BP 117/71 (BP Location: Left Arm)   Pulse 100   Temp 98.6 F (37 C)   Resp 17   Ht 5' 6 (1.676 m)   Wt 81.6 kg   LMP 07/14/2024 (Approximate)   SpO2 99%   BMI 29.05 kg/m  Physical Exam Vitals and nursing note reviewed. Exam conducted with a chaperone present.  Constitutional:      General: She is not in acute distress.    Appearance: Normal appearance. She is not ill-appearing.  HENT:     Head: Normocephalic.     Nose: Nose normal.  Cardiovascular:     Rate and Rhythm: Normal rate.  Pulmonary:     Effort: Pulmonary effort is normal.  Abdominal:     Palpations: Abdomen is soft.     Tenderness: There is no abdominal tenderness. There is no guarding or rebound.  Musculoskeletal:        General: Normal range of motion.     Cervical back: Normal range of motion.  Skin:    General: Skin is warm.  Neurological:     Mental Status: She is alert and oriented to person, place, and time.  Psychiatric:        Behavior: Behavior normal.    Pelvic: Exam chaperoned by Warren Dimes RN SSE: Moderate amount of dark vaginal bleeding noted, no clots, no visible POC, and cervix is visually open   MDM   HIGH  Chart reviewed Physical exam performed with pelvic chaperoned by RN IV Pain medication for discomfort ( Dilaudid  IVP 1 mg)  CBC: Hbg 10.8 (  Will send Rx for Iron supplement) CMP: K = 3.4 otherwise NM ABO is O- will offer RhoGAM to patient but it is believed that her pregnancy was less than 12 weeks IV fluid bolus for blood loss Rhogam ( RH Negative)  Bedside official ultrasound to be performed to rule out retained products of conception  Miscarriage in progress ( Complete)  Discussed ultrasound findings with Dr Zina at 1341 will offer her methergine  to help with her uterine bleeding and Rhogam d/t RH Negative state with unknown  GA . Rx for methergine  sent   Hemodynamically stable for discharge and OB F/U   Patient reassessed by RN @1300  and patient feels better after IVF and pain medication   Awaiting Rhogam administration . Discharge Home after   Reassured patient that miscarriage is common with ~1/4 of women experiencing it in their lifetime. Reassured patient that there is nothing she did or did not do to cause this. Reviewed most common reason is presumed to be genetic abnormalities that allow a pregnancy to start but not continue past an early stage, but realistically we do not know the cause in most cases. Reviewed that studies show  no definite difference between attempting another pregnancy sooner vs waiting, though some studies do show better live birth outcomes with trying sooner. Reviewed options of expectant, medical, or surgical management. After counseling she elected for expectant management. Reviewed that cramping, bleeding are normal. Reviewed warning signs of heavy vaginal bleeding soaking through >1 pad per hour, crescendo abdominal pain, and fever. . Blood type --/--/PENDING (11/26 1440), rhogam was given. We discussed return precautions including crescendo abdominal pain, heavy vaginal bleeding soaking >1 pad/hour, and fever.   Orders Placed This Encounter  Procedures   US  OB Comp Less 14 Wks    Standing Status:   Standing    Number of Occurrences:   1    Symptom/Reason for Exam:   Vaginal bleeding [[id number]]    US  OB Transvaginal    Standing Status:   Standing    Number of Occurrences:   1    Symptom/Reason for Exam:   Vaginal bleeding [[id number]]   CBC    Standing Status:   Standing    Number of Occurrences:   1   Comprehensive metabolic panel    Standing Status:   Standing    Number of Occurrences:   1   Urinalysis, Routine w reflex microscopic -Urine, Random    Standing Status:   Standing    Number of Occurrences:   1    Specimen Source:   Urine, Random [244]   Rh IG workup (includes ABO/Rh)    Standing Status:   Standing    Number of Occurrences:   1    Weeks of Gestation:   12    RhIG indication::   Routine Prenatal or 1st/2nd Trimester Bleeding   Discharge patient Discharge disposition: 01-Home or Self Care; Discharge patient date: 10/07/2024    Standing Status:   Standing    Number of Occurrences:   1    Discharge disposition:   01-Home or Self Care [1]    Discharge patient date:   10/07/2024   Discharge patient Discharge disposition: 01-Home or Self Care; Discharge patient date: 10/07/2024    Standing Status:   Standing    Number of Occurrences:   1    Discharge disposition:   01-Home or Self Care [1]    Discharge patient date:   10/07/2024      Results for orders placed or performed during the hospital encounter of 10/07/24 (from the past 24 hours)  CBC     Status: Abnormal   Collection Time: 10/07/24 12:08 PM  Result Value Ref Range   WBC 8.0 4.0 - 10.5 K/uL   RBC 3.77 (L) 3.87 - 5.11 MIL/uL   Hemoglobin 10.8 (L) 12.0 - 15.0 g/dL   HCT 69.0 (L) 63.9 - 53.9 %   MCV 82.0 80.0 - 100.0 fL   MCH 28.6 26.0 - 34.0 pg   MCHC 35.0 30.0 - 36.0 g/dL   RDW 87.7 88.4 - 84.4 %   Platelets 251 150 - 400 K/uL   nRBC 0.0 0.0 - 0.2 %  Comprehensive metabolic panel     Status: Abnormal   Collection Time: 10/07/24 12:08 PM  Result Value Ref Range   Sodium 135 135 - 145 mmol/L   Potassium 3.4 (L) 3.5 - 5.1 mmol/L   Chloride 101 98 - 111 mmol/L   CO2 22 22 - 32 mmol/L   Glucose, Bld  94 70 - 99 mg/dL   BUN 5 (L) 6 - 20 mg/dL   Creatinine, Ser 9.39 0.44 - 1.00 mg/dL  Calcium 8.7 (L) 8.9 - 10.3 mg/dL   Total Protein 6.2 (L) 6.5 - 8.1 g/dL   Albumin 3.5 3.5 - 5.0 g/dL   AST 22 15 - 41 U/L   ALT 19 0 - 44 U/L   Alkaline Phosphatase 37 (L) 38 - 126 U/L   Total Bilirubin 0.5 0.0 - 1.2 mg/dL   GFR, Estimated >39 >39 mL/min   Anion gap 12 5 - 15  Rh IG workup (includes ABO/Rh)     Status: None (Preliminary result)   Collection Time: 10/07/24  2:40 PM  Result Value Ref Range   Gestational Age(Wks)      12 Performed at [facility], [address], [place], [place] [address]    ABO/RH(D) PENDING    Antibody Screen PENDING     Narrative & Impression  EXAM: ULTRASOUND FIRST TRIMESTER   TECHNIQUE: Transabdominal and Transvaginal first trimester obstetric pelvic duplex ultrasound was performed with real-time imaging, color flow Doppler imaging, and spectral analysis.   COMPARISON: None available.   CLINICAL HISTORY: Vaginal bleeding.   FINDINGS:   UTERUS: No normal appearing intrauterine gestational sac seen. Small amount of fluid in the endometrial cavity likely representing blood products. Endometrial thickness measures 15 mm.   GESTATIONAL SAC(S): No normal appearing gestational sac seen.   YOLK SAC: Not visualized.   EMBRYO(<11WK) /FETUS(>=11WK): Not visualized.   RIGHT OVARY: Neither ovary visualized due to bowel gas.   LEFT OVARY: Neither ovary visualized due to bowel gas.   FREE FLUID: No abnormal free fluid identified.     IMPRESSION: 1. No IUP or adnexal mass visualized. Differential diagnosis includes IUP too early to visualize, recent spontaneous abortion, and occult ectopic pregnancy. Recommend follow-up with quantitative beta-hcg levels, and ultrasound as clinically warranted.   Electronically signed by: Norleen Kil MD 10/07/2024 01:09 PM EST RP Workstation: [id number]      I have reviewed the patient chart and performed  the physical exam . I have ordered & interpreted the lab results and reviewed them with the patient  Medications ordered as stated below.  I have also independently reviewed the ultrasound images and discussed them with Dr. Zina [facility] MAU attending) no evidence of retained products of conception at this time.  Miscarriage still in progress  A/P as described below.  Counseling and education provided and patient agreeable  with plan as described below. Verbalized understanding.    ASSESSMENT Medical screening exam complete  Miscarriage at 8 to [redacted] weeks gestation  Rh negative, antepartum  Vaginal bleeding affecting early pregnancy  Abdominal cramping complicating pregnancy    PLAN Future Appointments  Date Time Provider Department Center  10/13/2024 11:30 AM Tysinger, Alm RAMAN, PA-C [facility] [phone] [facility]    Discharge from MAU in stable condition  Rhogam given S/R/B  Follow up with Primary OB Provider in 1 week s/p Miscarriage ( List provided)  See AVS for full description of educational information and instructions provided to the patient at time of discharge   Warning signs for worsening condition that would warrant emergency follow-up discussed  Patient may return to MAU as needed    Allergies as of 10/07/2024       Reactions   Bee Venom Anaphylaxis        Medication List     TAKE these medications    acetaminophen  500 MG tablet Commonly known as: TYLENOL  Take 2 tablets (1,000 mg total) by mouth every 6 (six) hours as needed for moderate pain (pain score 4-6) or  mild pain (pain score 1-3).   ferrous sulfate  325 (65 FE) MG tablet Take 1 tablet (325 mg total) by mouth daily.   ibuprofen  600 MG tablet Commonly known as: ADVIL  Take 1 tablet (600 mg total) by mouth every 6 (six) hours as needed.   methylergonovine 0.2 MG tablet Commonly known as: METHERGINE  Take 1 tablet (0.2 mg total) by mouth 3 (three) times daily for 2 days.        Olam Dalton, MSN,  [facility], Center for [facility]    This chart was dictated using voice recognition software, Dragon. Despite the best efforts of this provider to proofread and correct errors, errors may still occur which can change documentation meaning.

## 2024-10-07 NOTE — MAU Note (Signed)
 RN attempted to call Blood bank for updated status. Blood Bank no answer, voicemail left. RN called main lab and was instructed that they can not give results. Lab has been pending for right at 2 hours.

## 2024-10-07 NOTE — Discharge Instructions (Signed)
[place] for [employer] at [employer] [facility]    Phone: [place] for [employer] at [place]   Phone: [place] for [employer] at [place]  Phone: [place] for [employer] at [facility]  Phone: [place] for [employer] at [place]  Phone: [place] for [place] at [facility]   Phone: [facility]       Phone: [phone]  [facility]    Phone: [phone]   [facility]    Phone: [phone]  [facility]    Phone: [place]    Phone: [phone]  [place] [facility]       Phone: [phone]   [place] [facility]  Phone: [place]    Phone: [phone]  [facility]   Phone: [phone]  [facility]      Phone: [phone]  [facility]    Phone: [phone]

## 2024-10-08 LAB — RH IG WORKUP (INCLUDES ABO/RH)
ABO/RH(D): O NEG
Antibody Screen: NEGATIVE
Gestational Age(Wks): 12
Unit division: 0

## 2024-10-12 HISTORY — PX: NO PAST SURGERIES: SHX2092

## 2024-10-13 ENCOUNTER — Ambulatory Visit: Payer: Self-pay | Admitting: Internal Medicine

## 2024-10-13 ENCOUNTER — Ambulatory Visit: Admitting: Medical

## 2024-10-13 ENCOUNTER — Encounter: Payer: Self-pay | Admitting: Medical

## 2024-10-13 VITALS — BP 110/70 | HR 73 | Ht 66.25 in | Wt 180.4 lb

## 2024-10-13 DIAGNOSIS — K219 Gastro-esophageal reflux disease without esophagitis: Secondary | ICD-10-CM | POA: Diagnosis not present

## 2024-10-13 DIAGNOSIS — Z8742 Personal history of other diseases of the female genital tract: Secondary | ICD-10-CM

## 2024-10-13 DIAGNOSIS — Z113 Encounter for screening for infections with a predominantly sexual mode of transmission: Secondary | ICD-10-CM

## 2024-10-13 DIAGNOSIS — B9689 Other specified bacterial agents as the cause of diseases classified elsewhere: Secondary | ICD-10-CM

## 2024-10-13 DIAGNOSIS — N760 Acute vaginitis: Secondary | ICD-10-CM | POA: Diagnosis not present

## 2024-10-13 DIAGNOSIS — Z87892 Personal history of anaphylaxis: Secondary | ICD-10-CM

## 2024-10-13 DIAGNOSIS — Z8759 Personal history of other complications of pregnancy, childbirth and the puerperium: Secondary | ICD-10-CM

## 2024-10-13 DIAGNOSIS — E876 Hypokalemia: Secondary | ICD-10-CM

## 2024-10-13 DIAGNOSIS — D649 Anemia, unspecified: Secondary | ICD-10-CM

## 2024-10-13 LAB — POCT WET PREP (WET MOUNT)
Clue Cells Wet Prep Whiff POC: NEGATIVE
Trichomonas Wet Prep HPF POC: ABSENT

## 2024-10-13 MED ORDER — FLUCONAZOLE 100 MG PO TABS: 100.0000 mg | ORAL_TABLET | Freq: Every day | ORAL | 0 refills | Status: AC

## 2024-10-13 MED ORDER — OMEPRAZOLE 40 MG PO CPDR: 40.0000 mg | DELAYED_RELEASE_CAPSULE | Freq: Every day | ORAL | 2 refills | Status: AC

## 2024-10-13 NOTE — Progress Notes (Signed)
 Subjective:  Marie Mathews is a 28 y.o. female who presents for Chief Complaint  Patient presents with   New Patient (Initial Visit)    New pt get established. Went to UC on 11/12 for routine STD check but found out she was pregnant and only treated for BV with over the counter medication. Started having a miscarriage on 11/26 and went to ED and was prescribed medication and given Rhogram shot. Started medication that was prescribed by ED on 12/1. Pt just wants to be checked Microplasma and uroplasma, HIV and herpes, and BV. Still having some bleeding.      Marie Mathews is a 28 year old female who presents for establish care visit and concerns about a recent miscarriage.  She experienced a recent miscarriage and was seen in the ED on 10/07/24.   She had significant bleeding that began around 6 AM and worsened, leading her to call an ambulance by 9 AM. This was her third pregnancy, with two previous miscarriages, including the loss of a son at six months. She received a RhoGAM shot during her recent hospital visit. Currently, she has brownish discharge. She has a family history of miscarriage, as her aunt experienced one, but denies any family history of blood clots or bleeding disorders.  She has a history of sickle cell trait and acid reflux. She was prescribed medication for acid reflux about a year ago but did not start it due to moving and other life circumstances. She associates her acid reflux with alcohol consumption, which she has since stopped a year ago. She was hospitalized a year ago for dehydration and vomiting, initially attributed to acid reflux but later discovered to be due to pregnancy. No recent issues with acid reflux.  She is currently taking ibuprofen  and iron once a day, having started the iron recently. She completed a prescribed gel treatment for BV but did not finish the over-the-counter treatment due to the miscarriage. She has a history of low potassium, calcium, and  protein levels, which may be related to recent IV fluid administration or nutritional intake. She reports eating once a day, with a decreased appetite since the miscarriage.  She is concerned about potential STDs following a recent partner and requests testing for STDs, HIV, herpes, mycoplasma, and ureaplasma. She was previously tested negative for gonorrhea and chlamydia but positive for BV and yeast.  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 She lives with her daughter and recently started a new job at [employer]. She previously worked in housekeeping. She does not smoke or use drugs and has not consumed alcohol for about a year.  No other aggravating or relieving factors.    No other c/o.  Past Medical History:  Diagnosis Date   Blood type, Rh negative 12/12/2017   GERD (gastroesophageal reflux disease)    Herniated disc, cervical    Sickle cell trait 12/13/2017   Current Outpatient Medications on File Prior to Visit  Medication Sig Dispense Refill   acetaminophen  (TYLENOL ) 500 MG tablet Take 2 tablets (1,000 mg total) by mouth every 6 (six) hours as needed for moderate pain (pain score 4-6) or mild pain (pain score 1-3).     ferrous sulfate  325 (65 FE) MG tablet Take 1 tablet (325 mg total) by mouth daily. 30 tablet 0   ibuprofen  (ADVIL ) 600 MG tablet Take 1 tablet (600 mg total) by mouth every 6 (  six) hours as needed. 30 tablet 0   methylergonovine (METHERGINE ) 0.2 MG tablet Take 0.2 mg by mouth 3 (three) times daily.     No current facility-administered medications on file prior to visit.    The following portions of the patient's history were reviewed and updated as appropriate: allergies, current medications, past family history, past medical history, past social history, past surgical history and problem list.  ROS Otherwise as in subjective above    Objective: BP 110/70   Pulse 73    Ht 5' 6.25 (1.683 m)   Wt 180 lb 6.4 oz (81.8 kg)   LMP 10/07/2024 (Approximate)   Breastfeeding No   BMI 28.90 kg/m   General appearance: alert, no distress, well developed, well nourished Neck: supple, no lymphadenopathy, no thyromegaly, no masses Heart: RRR, normal S1, S2, no murmurs Lungs: CTA bilaterally, no wheezes, rhonchi, or rales Abdomen: +bs, soft, non tender, non distended, no masses, no hepatomegaly, no splenomegaly Pulses: 2+ radial pulses, 2+ pedal pulses, normal cap refill Ext: no edema Gyn: Normal external genitalia without lesions, vagina with normal mucosa, cervix without lesions, no cervical motion tenderness, no abnormal vaginal discharge.  Uterus and adnexa not enlarged, nontender, no masses.  Swabs taken.  Exam chaperoned by nurse.   Assessment: Encounter Diagnoses  Name Primary?   Bacterial vaginosis Yes   History of miscarriage    Gastroesophageal reflux disease, unspecified whether esophagitis present    Screen for STD (sexually transmitted disease)    History of anaphylaxis    Anemia, unspecified type    Hypokalemia    History of abnormal cervical Pap smear      Plan: Recent miscarriage Experienced miscarriage with significant bleeding, seen in ED 10/07/24.  Vaginal bleeding at this point improving, on ibuprofen  and just started Methergine  yesterday per gynecology consult in emergency dept.   Brownish discharge indicates ongoing bleeding. Received RhoGAM at 10/07/24 visit.  - Continue ibuprofen  and iron supplementation. - Recheck blood count to assess anemia. - Consider hypercoagulable blood eval   Labs recently showing Anemia and electrolyte abnormalities (hypokalemia, hypocalcemia) Recent labs showed low potassium, calcium, and protein levels, possibly due to IV fluids or inadequate nutrition.  - Rechecked electrolytes and blood count. - Encouraged eating at least twice a day with a variety of nutrients. - Recommended daily prenatal  vitamin. - Encouraged drinking water throughout the day.   Bacterial vaginosis and vulvovaginal candidiasis Previously tested positive for bacterial vaginosis and yeast infection. Did not complete treatment due to miscarriage. Concerned about recurrence. - Ordered STD testing, including HIV, herpes, mycoplasma, and ureaplasma. -begin Diflucan  after 2 days to finish out the Methergine  first.    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 Previous colposcopy performed. Discussed natural clearance of HPV and potential need for further evaluation. - Repeat Pap smear in January or February . - Consider colposcopy if Pap smear shows abnormalities.   General Health Maintenance Discussed general health maintenance, including diet and exercise. - Encouraged regular exercise. - Recommended a balanced diet with a variety of nutrients.   GERD -can use Omeprazole 40mg  daily -avoid GERD triggers   Hx/o anaphylaxis -consider having Epipen  on hand in the future for emergency -anaphylaxis was a child    Marie Mathews was seen today for new patient (initial visit).  Diagnoses and all orders for this visit:  Bacterial vaginosis -     POCT Wet Prep [facility])  History of miscarriage  Gastroesophageal reflux disease, unspecified whether esophagitis present  Screen for STD (sexually transmitted disease) -     HIV Antibody (routine testing w rflx) -     RPR W/RFLX TO RPR TITER, TREPONEMAL AB, SCREEN AND DIAGNOSIS -     Cancel: Mycoplasma / Ureaplasma Culture -     Hepatitis C antibody -     Hepatitis B surface antigen -     HSV 1 and 2 Ab, IgG -     POCT Wet Prep ([place]et [place]) -     Mycoplasma / Ureaplasma Culture  History of anaphylaxis  Anemia, unspecified type -     Iron -     CBC  Hypokalemia -     Basic metabolic panel with GFR -     Magnesium  History of abnormal cervical Pap smear  Other orders -      omeprazole (PRILOSEC) 40 MG capsule; Take 1 capsule (40 mg total) by mouth daily. -     fluconazole  (DIFLUCAN ) 100 MG tablet; Take 1 tablet (100 mg total) by mouth daily.    Follow up: pending labs

## 2024-10-13 NOTE — Patient Instructions (Signed)
 Recent miscarriage Experienced miscarriage with significant bleeding, seen in ED 10/07/24.  Vaginal bleeding at this point improving, on ibuprofen  and just started Methergine  yesterday per gynecology consult in emergency dept.   Brownish discharge indicates ongoing bleeding. Received RhoGAM at 10/07/24 visit.  - Continue ibuprofen  and iron supplementation. - Recheck blood count to assess anemia. - Consider hypercoagulable blood eval   Labs recently showing Anemia and electrolyte abnormalities (hypokalemia, hypocalcemia) Recent labs showed low potassium, calcium, and protein levels, possibly due to IV fluids or inadequate nutrition.  - Rechecked electrolytes and blood count. - Encouraged eating at least twice a day with a variety of nutrients. - Recommended daily prenatal vitamin. - Encouraged drinking water throughout the day.   Bacterial vaginosis and vulvovaginal candidiasis Previously tested positive for bacterial vaginosis and yeast infection. Did not complete treatment due to miscarriage. Concerned about recurrence. - Ordered STD testing, including HIV, herpes, mycoplasma, and ureaplasma.   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 Previous colposcopy performed. Discussed natural clearance of HPV and potential need for further evaluation. - Repeat Pap smear in January or February . - Consider colposcopy if Pap smear shows abnormalities.   General Health Maintenance Discussed general health maintenance, including diet and exercise. - Encouraged regular exercise. - Recommended a balanced diet with a variety of nutrients.   GERD -can use Omeprazole 40mg  daily -avoid GERD triggers   Hx/o anaphylaxis -consider having Epipen  on hand in the future for emergency -anaphylaxis was a child

## 2024-10-15 NOTE — Progress Notes (Signed)
 Results through MyChart

## 2024-10-16 LAB — HSV 1 AND 2 AB, IGG
HSV 1 Glycoprotein G Ab, IgG: NONREACTIVE
HSV 2 IgG, Type Spec: REACTIVE — AB

## 2024-10-16 LAB — BASIC METABOLIC PANEL WITH GFR
BUN/Creatinine Ratio: 9 (ref 9–23)
BUN: 6 mg/dL (ref 6–20)
CO2: 23 mmol/L (ref 20–29)
Calcium: 9.3 mg/dL (ref 8.7–10.2)
Chloride: 107 mmol/L — ABNORMAL HIGH (ref 96–106)
Creatinine, Ser: 0.66 mg/dL (ref 0.57–1.00)
Glucose: 88 mg/dL (ref 70–99)
Potassium: 4.2 mmol/L (ref 3.5–5.2)
Sodium: 143 mmol/L (ref 134–144)
eGFR: 123 mL/min/1.73 (ref >59)

## 2024-10-16 LAB — CBC
Hematocrit: 32.9 % — ABNORMAL LOW (ref 34.0–46.6)
Hemoglobin: 10.2 g/dL — ABNORMAL LOW (ref 11.1–15.9)
MCH: 28.3 pg (ref 26.6–33.0)
MCHC: 31 g/dL — ABNORMAL LOW (ref 31.5–35.7)
MCV: 91 fL (ref 79–97)
Platelets: 310 x10E3/uL (ref 150–450)
RBC: 3.6 x10E6/uL — ABNORMAL LOW (ref 3.77–5.28)
RDW: 12.7 % (ref 11.7–15.4)
WBC: 7.3 x10E3/uL (ref 3.4–10.8)

## 2024-10-16 LAB — MYCOPLASMA / UREAPLASMA CULTURE

## 2024-10-16 LAB — HEPATITIS C ANTIBODY: Hep C Virus Ab: NONREACTIVE

## 2024-10-16 LAB — SYPHILIS: RPR W/REFLEX TO RPR TITER AND TREPONEMAL ANTIBODIES, TRADITIONAL SCREENING AND DIAGNOSIS ALGORITHM: RPR Ser Ql: NONREACTIVE

## 2024-10-16 LAB — HIV ANTIBODY (ROUTINE TESTING W REFLEX): HIV Screen 4th Generation wRfx: NONREACTIVE

## 2024-10-16 LAB — IRON: Iron: 58 ug/dL (ref 27–159)

## 2024-10-16 LAB — MAGNESIUM: Magnesium: 2 mg/dL (ref 1.6–2.3)

## 2024-10-16 LAB — HEPATITIS B SURFACE ANTIGEN

## 2024-10-20 ENCOUNTER — Other Ambulatory Visit: Payer: Self-pay | Admitting: Medical

## 2024-10-20 LAB — MYCOPLASMA / UREAPLASMA CULTURE: Ureaplasma urealyticum: POSITIVE — AB

## 2024-10-20 MED ORDER — DOXYCYCLINE HYCLATE 100 MG PO TABS: 100.0000 mg | ORAL_TABLET | Freq: Two times a day (BID) | ORAL | 0 refills | Status: AC

## 2024-10-20 NOTE — Progress Notes (Signed)
 Results thru my chart
